# Patient Record
Sex: Male | Born: 1935
Health system: Southern US, Community
[De-identification: ages and names within clinical notes are randomized; demographics above are authoritative.]

## PROBLEM LIST (undated history)

## (undated) DIAGNOSIS — F419 Anxiety disorder, unspecified: Secondary | ICD-10-CM

## (undated) DIAGNOSIS — E079 Disorder of thyroid, unspecified: Secondary | ICD-10-CM

## (undated) DIAGNOSIS — K219 Gastro-esophageal reflux disease without esophagitis: Secondary | ICD-10-CM

## (undated) DIAGNOSIS — I6522 Occlusion and stenosis of left carotid artery: Secondary | ICD-10-CM

## (undated) HISTORY — DX: Gastro-esophageal reflux disease without esophagitis: K21.9

## (undated) HISTORY — DX: Anxiety disorder, unspecified: F41.9

## (undated) HISTORY — DX: Occlusion and stenosis of left carotid artery: I65.22

## (undated) HISTORY — DX: Disorder of thyroid, unspecified: E07.9

---

## 2005-03-10 ENCOUNTER — Emergency Department (HOSPITAL_COMMUNITY): Admission: EM | Admit: 2005-03-10 | Discharge: 2005-03-10 | Payer: Self-pay | Admitting: *Deleted

## 2007-02-01 ENCOUNTER — Encounter: Admission: RE | Admit: 2007-02-01 | Discharge: 2007-02-01 | Payer: Self-pay | Admitting: Internal Medicine

## 2007-02-15 ENCOUNTER — Encounter: Admission: RE | Admit: 2007-02-15 | Discharge: 2007-02-15 | Payer: Self-pay | Admitting: Internal Medicine

## 2008-02-24 ENCOUNTER — Encounter: Admission: RE | Admit: 2008-02-24 | Discharge: 2008-02-24 | Payer: Self-pay | Admitting: Internal Medicine

## 2009-10-30 ENCOUNTER — Encounter: Admission: RE | Admit: 2009-10-30 | Discharge: 2009-10-30 | Payer: Self-pay | Admitting: Internal Medicine

## 2010-02-05 ENCOUNTER — Encounter: Admission: RE | Admit: 2010-02-05 | Discharge: 2010-02-05 | Payer: Self-pay | Admitting: General Surgery

## 2010-10-04 ENCOUNTER — Emergency Department (HOSPITAL_COMMUNITY)
Admission: EM | Admit: 2010-10-04 | Discharge: 2010-10-04 | Disposition: A | Discharge status: Home / Self Care | Payer: Medicare Other | Attending: Emergency Medicine | Admitting: Emergency Medicine

## 2010-10-04 DIAGNOSIS — R0609 Other forms of dyspnea: Secondary | ICD-10-CM | POA: Insufficient documentation

## 2010-10-04 DIAGNOSIS — F410 Panic disorder [episodic paroxysmal anxiety] without agoraphobia: Secondary | ICD-10-CM | POA: Insufficient documentation

## 2010-10-04 DIAGNOSIS — I10 Essential (primary) hypertension: Secondary | ICD-10-CM | POA: Insufficient documentation

## 2010-10-04 DIAGNOSIS — R110 Nausea: Secondary | ICD-10-CM | POA: Insufficient documentation

## 2010-10-04 DIAGNOSIS — Z79899 Other long term (current) drug therapy: Secondary | ICD-10-CM | POA: Insufficient documentation

## 2010-10-04 DIAGNOSIS — R454 Irritability and anger: Secondary | ICD-10-CM | POA: Insufficient documentation

## 2010-10-04 DIAGNOSIS — R0989 Other specified symptoms and signs involving the circulatory and respiratory systems: Secondary | ICD-10-CM | POA: Insufficient documentation

## 2010-10-04 LAB — CBC
MCHC: 35.1 g/dL (ref 30.0–36.0)
MCV: 92.7 fL (ref 78.0–100.0)
RDW: 11.9 % (ref 11.5–15.5)
WBC: 8.4 10*3/uL (ref 4.0–10.5)

## 2010-10-04 LAB — DIFFERENTIAL
Lymphocytes Relative: 56 % — ABNORMAL HIGH (ref 12–46)
Lymphs Abs: 4.7 10*3/uL — ABNORMAL HIGH (ref 0.7–4.0)
Monocytes Absolute: 0.6 10*3/uL (ref 0.1–1.0)
Neutro Abs: 2.8 10*3/uL (ref 1.7–7.7)

## 2010-10-04 LAB — BASIC METABOLIC PANEL
BUN: 14 mg/dL (ref 6–23)
CO2: 29 mEq/L (ref 19–32)
GFR calc non Af Amer: 55 mL/min — ABNORMAL LOW (ref >60)
Glucose, Bld: 107 mg/dL — ABNORMAL HIGH (ref 70–99)

## 2012-04-12 ENCOUNTER — Other Ambulatory Visit: Payer: Self-pay | Admitting: Internal Medicine

## 2012-04-12 ENCOUNTER — Ambulatory Visit
Admission: RE | Admit: 2012-04-12 | Discharge: 2012-04-12 | Disposition: A | Discharge status: Home / Self Care | Payer: Medicare Other | Source: Ambulatory Visit | Attending: Internal Medicine | Admitting: Internal Medicine

## 2012-04-12 DIAGNOSIS — R52 Pain, unspecified: Secondary | ICD-10-CM

## 2012-04-13 ENCOUNTER — Other Ambulatory Visit: Payer: Self-pay | Admitting: Internal Medicine

## 2012-04-13 DIAGNOSIS — R52 Pain, unspecified: Secondary | ICD-10-CM

## 2013-04-11 ENCOUNTER — Other Ambulatory Visit: Payer: Self-pay | Admitting: Internal Medicine

## 2013-04-11 DIAGNOSIS — R0989 Other specified symptoms and signs involving the circulatory and respiratory systems: Secondary | ICD-10-CM

## 2013-04-13 ENCOUNTER — Ambulatory Visit
Admission: RE | Admit: 2013-04-13 | Discharge: 2013-04-13 | Disposition: A | Discharge status: Home / Self Care | Payer: Medicare Other | Source: Ambulatory Visit | Attending: Internal Medicine | Admitting: Internal Medicine

## 2013-04-13 DIAGNOSIS — R0989 Other specified symptoms and signs involving the circulatory and respiratory systems: Secondary | ICD-10-CM

## 2015-11-28 DIAGNOSIS — R972 Elevated prostate specific antigen [PSA]: Secondary | ICD-10-CM | POA: Diagnosis not present

## 2015-11-28 DIAGNOSIS — B359 Dermatophytosis, unspecified: Secondary | ICD-10-CM | POA: Diagnosis not present

## 2016-01-30 DIAGNOSIS — F419 Anxiety disorder, unspecified: Secondary | ICD-10-CM | POA: Diagnosis not present

## 2016-04-17 ENCOUNTER — Other Ambulatory Visit: Payer: Self-pay | Admitting: Internal Medicine

## 2016-04-17 ENCOUNTER — Ambulatory Visit
Admission: RE | Admit: 2016-04-17 | Discharge: 2016-04-17 | Disposition: A | Discharge status: Home / Self Care | Payer: Medicare Other | Source: Ambulatory Visit | Attending: Internal Medicine | Admitting: Internal Medicine

## 2016-04-17 DIAGNOSIS — W19XXXD Unspecified fall, subsequent encounter: Secondary | ICD-10-CM

## 2016-04-17 DIAGNOSIS — R079 Chest pain, unspecified: Secondary | ICD-10-CM | POA: Diagnosis not present

## 2016-04-17 DIAGNOSIS — S2231XA Fracture of one rib, right side, initial encounter for closed fracture: Secondary | ICD-10-CM | POA: Diagnosis not present

## 2016-04-30 DIAGNOSIS — S2239XA Fracture of one rib, unspecified side, initial encounter for closed fracture: Secondary | ICD-10-CM | POA: Diagnosis not present

## 2016-04-30 DIAGNOSIS — Z0000 Encounter for general adult medical examination without abnormal findings: Secondary | ICD-10-CM | POA: Diagnosis not present

## 2016-04-30 DIAGNOSIS — Z23 Encounter for immunization: Secondary | ICD-10-CM | POA: Diagnosis not present

## 2016-04-30 DIAGNOSIS — E039 Hypothyroidism, unspecified: Secondary | ICD-10-CM | POA: Diagnosis not present

## 2016-05-06 DIAGNOSIS — R972 Elevated prostate specific antigen [PSA]: Secondary | ICD-10-CM | POA: Diagnosis not present

## 2016-05-06 DIAGNOSIS — R351 Nocturia: Secondary | ICD-10-CM | POA: Diagnosis not present

## 2016-05-06 DIAGNOSIS — N401 Enlarged prostate with lower urinary tract symptoms: Secondary | ICD-10-CM | POA: Diagnosis not present

## 2016-07-07 DIAGNOSIS — L506 Contact urticaria: Secondary | ICD-10-CM | POA: Diagnosis not present

## 2016-07-28 DIAGNOSIS — L500 Allergic urticaria: Secondary | ICD-10-CM | POA: Diagnosis not present

## 2016-09-22 ENCOUNTER — Other Ambulatory Visit: Payer: Self-pay | Admitting: Internal Medicine

## 2016-09-22 ENCOUNTER — Ambulatory Visit
Admission: RE | Admit: 2016-09-22 | Discharge: 2016-09-22 | Disposition: A | Discharge status: Home / Self Care | Payer: Medicare Other | Source: Ambulatory Visit | Attending: Internal Medicine | Admitting: Internal Medicine

## 2016-09-22 DIAGNOSIS — I4510 Unspecified right bundle-branch block: Secondary | ICD-10-CM | POA: Diagnosis not present

## 2016-09-22 DIAGNOSIS — R0602 Shortness of breath: Secondary | ICD-10-CM

## 2016-09-22 DIAGNOSIS — R079 Chest pain, unspecified: Secondary | ICD-10-CM | POA: Diagnosis not present

## 2016-09-22 DIAGNOSIS — R0902 Hypoxemia: Secondary | ICD-10-CM | POA: Diagnosis not present

## 2016-09-24 ENCOUNTER — Other Ambulatory Visit: Payer: Self-pay | Admitting: Internal Medicine

## 2016-09-24 DIAGNOSIS — R0600 Dyspnea, unspecified: Secondary | ICD-10-CM

## 2016-09-25 ENCOUNTER — Ambulatory Visit (INDEPENDENT_AMBULATORY_CARE_PROVIDER_SITE_OTHER): Payer: Medicare Other | Admitting: Internal Medicine

## 2016-09-25 DIAGNOSIS — R0600 Dyspnea, unspecified: Secondary | ICD-10-CM

## 2016-09-25 LAB — PULMONARY FUNCTION TEST
DL/VA % PRED: 56 %
DL/VA: 2.74 ml/min/mmHg/L
DLCO COR % PRED: 41 %
DLCO UNC: 16.52 ml/min/mmHg
DLCO cor: 16.08 ml/min/mmHg
DLCO unc % pred: 43 %
FEF 25-75 POST: 1.88 L/s
FEF 25-75 Pre: 1.8 L/sec
FEF2575-%CHANGE-POST: 4 %
FEF2575-%PRED-POST: 81 %
FEF2575-%Pred-Pre: 77 %
FEV1-%CHANGE-POST: 1 %
FEV1-%PRED-POST: 82 %
FEV1-%Pred-Pre: 81 %
FEV1-Post: 2.79 L
FEV1-Pre: 2.75 L
FEV1FVC-%CHANGE-POST: 9 %
FEV1FVC-%PRED-PRE: 100 %
FEV6-%Change-Post: -8 %
FEV6-%Pred-Post: 79 %
FEV6-%Pred-Pre: 87 %
FEV6-Post: 3.54 L
FEV6-Pre: 3.86 L
FEV6FVC-%Pred-Post: 106 %
FEV6FVC-%Pred-Pre: 106 %
FVC-%CHANGE-POST: -7 %
FVC-%PRED-POST: 75 %
FVC-%PRED-PRE: 81 %
FVC-POST: 3.58 L
FVC-PRE: 3.86 L
PRE FEV6/FVC RATIO: 100 %
Post FEV1/FVC ratio: 78 %
Post FEV6/FVC ratio: 100 %
Pre FEV1/FVC ratio: 71 %
RV % PRED: 88 %
RV: 2.6 L
TLC % pred: 79 %
TLC: 6.32 L

## 2016-09-25 NOTE — Progress Notes (Signed)
PFT done today. 

## 2016-09-28 ENCOUNTER — Telehealth: Payer: Self-pay | Admitting: Internal Medicine

## 2016-09-28 NOTE — Telephone Encounter (Signed)
Called Dr. Thomasene Lot office and spoke with Toniann Fail, states she would need me to speak to the nurse who is in with a patient currently.  Will await call back.

## 2016-09-29 NOTE — Telephone Encounter (Signed)
Spoke with Kevin Mata, states that they did not get all the pages of the PFT results we faxed. This has been re-faxed to Dr Thomasene Lot office. Aware to contact our office if full fax is not received. Nothing further needed.

## 2016-09-29 NOTE — Telephone Encounter (Signed)
Kevin Mata with Dr. Thomasene Lot office returned call.  States she received the PFT report with the numbers but still needs interpretation of the report.  CB is [phone], fax [phone].

## 2016-09-30 DIAGNOSIS — R0902 Hypoxemia: Secondary | ICD-10-CM | POA: Diagnosis not present

## 2016-09-30 DIAGNOSIS — J449 Chronic obstructive pulmonary disease, unspecified: Secondary | ICD-10-CM | POA: Diagnosis not present

## 2016-12-15 DIAGNOSIS — I491 Atrial premature depolarization: Secondary | ICD-10-CM | POA: Diagnosis not present

## 2016-12-15 DIAGNOSIS — J449 Chronic obstructive pulmonary disease, unspecified: Secondary | ICD-10-CM | POA: Diagnosis not present

## 2016-12-16 ENCOUNTER — Other Ambulatory Visit (HOSPITAL_COMMUNITY): Payer: Self-pay | Admitting: Internal Medicine

## 2016-12-16 DIAGNOSIS — R0602 Shortness of breath: Secondary | ICD-10-CM

## 2016-12-18 ENCOUNTER — Ambulatory Visit (HOSPITAL_COMMUNITY)
Admission: RE | Admit: 2016-12-18 | Discharge: 2016-12-18 | Disposition: A | Discharge status: Home / Self Care | Payer: Medicare Other | Source: Ambulatory Visit | Attending: Internal Medicine | Admitting: Internal Medicine

## 2016-12-18 DIAGNOSIS — I5030 Unspecified diastolic (congestive) heart failure: Secondary | ICD-10-CM | POA: Diagnosis not present

## 2016-12-18 DIAGNOSIS — I071 Rheumatic tricuspid insufficiency: Secondary | ICD-10-CM | POA: Insufficient documentation

## 2016-12-18 DIAGNOSIS — R0602 Shortness of breath: Secondary | ICD-10-CM | POA: Diagnosis not present

## 2017-03-04 DIAGNOSIS — S0500XA Injury of conjunctiva and corneal abrasion without foreign body, unspecified eye, initial encounter: Secondary | ICD-10-CM | POA: Diagnosis not present

## 2017-03-08 DIAGNOSIS — Z23 Encounter for immunization: Secondary | ICD-10-CM | POA: Diagnosis not present

## 2017-03-16 DIAGNOSIS — H524 Presbyopia: Secondary | ICD-10-CM | POA: Diagnosis not present

## 2017-04-20 DIAGNOSIS — K639 Disease of intestine, unspecified: Secondary | ICD-10-CM | POA: Diagnosis not present

## 2017-04-20 DIAGNOSIS — K5120 Ulcerative (chronic) proctitis without complications: Secondary | ICD-10-CM | POA: Diagnosis not present

## 2017-05-04 DIAGNOSIS — E039 Hypothyroidism, unspecified: Secondary | ICD-10-CM | POA: Diagnosis not present

## 2017-05-04 DIAGNOSIS — I739 Peripheral vascular disease, unspecified: Secondary | ICD-10-CM | POA: Diagnosis not present

## 2017-05-04 DIAGNOSIS — N200 Calculus of kidney: Secondary | ICD-10-CM | POA: Diagnosis not present

## 2017-05-04 DIAGNOSIS — Z6841 Body Mass Index (BMI) 40.0 and over, adult: Secondary | ICD-10-CM | POA: Diagnosis not present

## 2017-07-05 DIAGNOSIS — K1370 Unspecified lesions of oral mucosa: Secondary | ICD-10-CM | POA: Diagnosis not present

## 2017-07-05 DIAGNOSIS — E7800 Pure hypercholesterolemia, unspecified: Secondary | ICD-10-CM | POA: Diagnosis not present

## 2017-07-05 DIAGNOSIS — E871 Hypo-osmolality and hyponatremia: Secondary | ICD-10-CM | POA: Diagnosis not present

## 2017-07-05 DIAGNOSIS — I10 Essential (primary) hypertension: Secondary | ICD-10-CM | POA: Diagnosis not present

## 2017-09-02 DIAGNOSIS — J392 Other diseases of pharynx: Secondary | ICD-10-CM | POA: Diagnosis not present

## 2017-09-02 DIAGNOSIS — E871 Hypo-osmolality and hyponatremia: Secondary | ICD-10-CM | POA: Diagnosis not present

## 2017-12-29 DIAGNOSIS — I491 Atrial premature depolarization: Secondary | ICD-10-CM | POA: Diagnosis not present

## 2017-12-29 DIAGNOSIS — R42 Dizziness and giddiness: Secondary | ICD-10-CM | POA: Diagnosis not present

## 2018-03-16 DIAGNOSIS — Z23 Encounter for immunization: Secondary | ICD-10-CM | POA: Diagnosis not present

## 2018-05-05 DIAGNOSIS — M11261 Other chondrocalcinosis, right knee: Secondary | ICD-10-CM | POA: Diagnosis not present

## 2018-05-05 DIAGNOSIS — E039 Hypothyroidism, unspecified: Secondary | ICD-10-CM | POA: Diagnosis not present

## 2018-05-05 DIAGNOSIS — N200 Calculus of kidney: Secondary | ICD-10-CM | POA: Diagnosis not present

## 2018-05-05 DIAGNOSIS — E7800 Pure hypercholesterolemia, unspecified: Secondary | ICD-10-CM | POA: Diagnosis not present

## 2018-05-05 DIAGNOSIS — I10 Essential (primary) hypertension: Secondary | ICD-10-CM | POA: Diagnosis not present

## 2018-05-05 DIAGNOSIS — J441 Chronic obstructive pulmonary disease with (acute) exacerbation: Secondary | ICD-10-CM | POA: Diagnosis not present

## 2018-05-09 DIAGNOSIS — H6123 Impacted cerumen, bilateral: Secondary | ICD-10-CM | POA: Diagnosis not present

## 2018-10-26 DIAGNOSIS — D559 Anemia due to enzyme disorder, unspecified: Secondary | ICD-10-CM | POA: Diagnosis not present

## 2018-10-26 DIAGNOSIS — R55 Syncope and collapse: Secondary | ICD-10-CM | POA: Diagnosis not present

## 2018-10-26 DIAGNOSIS — R799 Abnormal finding of blood chemistry, unspecified: Secondary | ICD-10-CM | POA: Diagnosis not present

## 2018-10-26 DIAGNOSIS — E039 Hypothyroidism, unspecified: Secondary | ICD-10-CM | POA: Diagnosis not present

## 2018-10-26 DIAGNOSIS — J449 Chronic obstructive pulmonary disease, unspecified: Secondary | ICD-10-CM | POA: Diagnosis not present

## 2018-10-26 DIAGNOSIS — E038 Other specified hypothyroidism: Secondary | ICD-10-CM | POA: Diagnosis not present

## 2018-11-09 DIAGNOSIS — I471 Supraventricular tachycardia: Secondary | ICD-10-CM | POA: Diagnosis not present

## 2018-12-30 DIAGNOSIS — R419 Unspecified symptoms and signs involving cognitive functions and awareness: Secondary | ICD-10-CM | POA: Diagnosis not present

## 2019-02-03 IMAGING — CR DG CHEST 2V
2 series · 2 of 2 positions shown · non-contrast
Comparison: 04/17/2016

CLINICAL DATA: Shortness of breath with exertion, dizziness, former
smoker

EXAM:
CHEST  2 VIEW

[w chest pa]
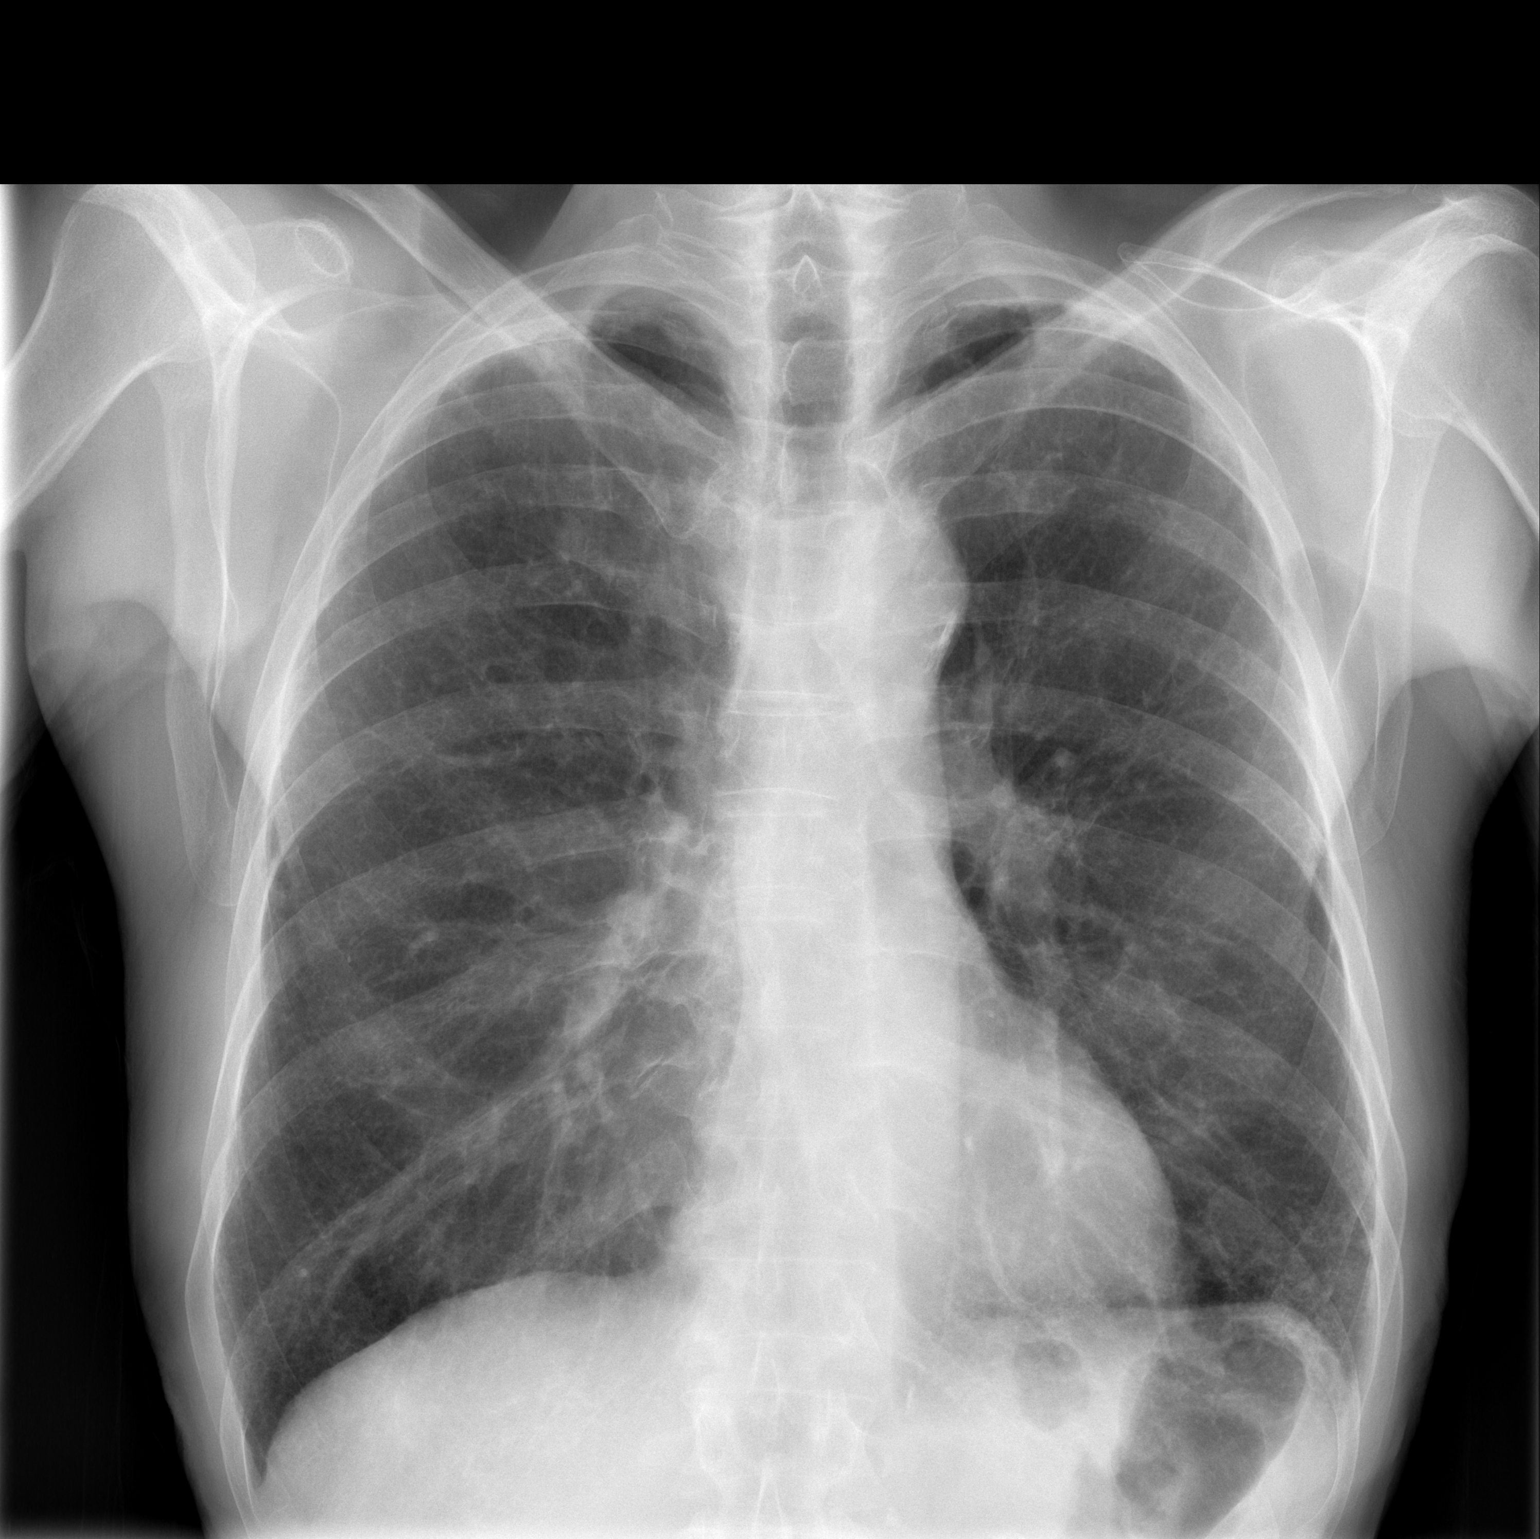

[w chest lat]
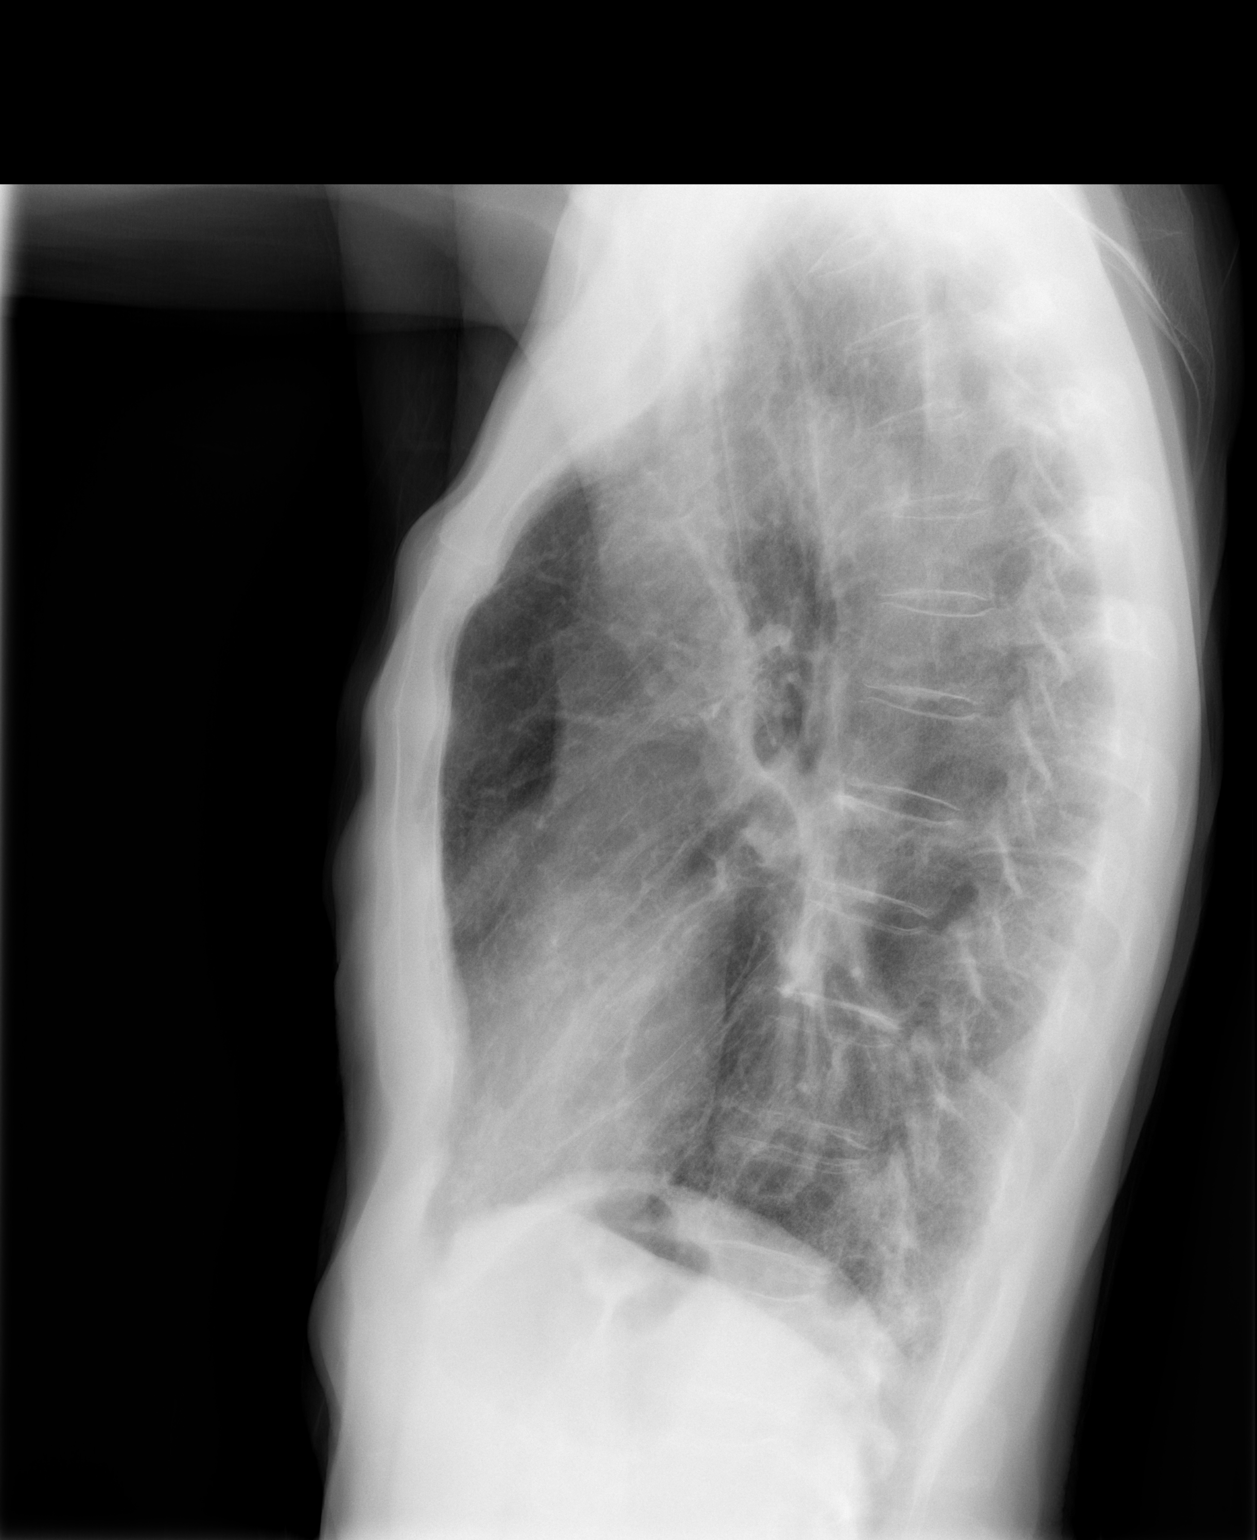

[2 of 2 positions shown; findings below may reference images not displayed]

FINDINGS: Normal heart size, mediastinal contours, and pulmonary vascularity.

Atherosclerotic calcification aorta.

Lungs appear emphysematous with mild biapical scarring.

Mild chronic interstitial changes/fibrosis at the LEFT lung base.

Remaining lungs clear.

No pleural effusion or pneumothorax.

Bones demineralized.
IMPRESSION: Emphysematous changes with biapical scarring and LEFT basilar
fibrosis.

No acute abnormalities.

## 2019-02-14 DIAGNOSIS — Z23 Encounter for immunization: Secondary | ICD-10-CM | POA: Diagnosis not present

## 2019-05-09 DIAGNOSIS — E039 Hypothyroidism, unspecified: Secondary | ICD-10-CM | POA: Diagnosis not present

## 2019-05-09 DIAGNOSIS — K5700 Diverticulitis of small intestine with perforation and abscess without bleeding: Secondary | ICD-10-CM | POA: Diagnosis not present

## 2019-05-09 DIAGNOSIS — I10 Essential (primary) hypertension: Secondary | ICD-10-CM | POA: Diagnosis not present

## 2019-05-09 DIAGNOSIS — I491 Atrial premature depolarization: Secondary | ICD-10-CM | POA: Diagnosis not present

## 2019-05-09 DIAGNOSIS — K279 Peptic ulcer, site unspecified, unspecified as acute or chronic, without hemorrhage or perforation: Secondary | ICD-10-CM | POA: Diagnosis not present

## 2019-05-09 DIAGNOSIS — J449 Chronic obstructive pulmonary disease, unspecified: Secondary | ICD-10-CM | POA: Diagnosis not present

## 2019-07-27 DIAGNOSIS — R0600 Dyspnea, unspecified: Secondary | ICD-10-CM | POA: Diagnosis not present

## 2019-07-27 DIAGNOSIS — F419 Anxiety disorder, unspecified: Secondary | ICD-10-CM | POA: Diagnosis not present

## 2019-07-27 DIAGNOSIS — J449 Chronic obstructive pulmonary disease, unspecified: Secondary | ICD-10-CM | POA: Diagnosis not present

## 2019-07-27 DIAGNOSIS — E7849 Other hyperlipidemia: Secondary | ICD-10-CM | POA: Diagnosis not present

## 2019-07-27 DIAGNOSIS — E46 Unspecified protein-calorie malnutrition: Secondary | ICD-10-CM | POA: Diagnosis not present

## 2019-08-04 ENCOUNTER — Ambulatory Visit: Payer: Medicare Other

## 2019-08-04 ENCOUNTER — Encounter: Payer: Self-pay | Admitting: Cardiology

## 2019-08-04 ENCOUNTER — Other Ambulatory Visit: Payer: Self-pay

## 2019-08-04 ENCOUNTER — Ambulatory Visit: Payer: Medicare Other | Admitting: Cardiology

## 2019-08-04 VITALS — BP 146/70 | HR 82 | Temp 98.6°F | Resp 11 | Ht 75.0 in | Wt 145.0 lb

## 2019-08-04 DIAGNOSIS — I951 Orthostatic hypotension: Secondary | ICD-10-CM | POA: Diagnosis not present

## 2019-08-04 DIAGNOSIS — R42 Dizziness and giddiness: Secondary | ICD-10-CM | POA: Diagnosis not present

## 2019-08-04 DIAGNOSIS — I4510 Unspecified right bundle-branch block: Secondary | ICD-10-CM

## 2019-08-04 DIAGNOSIS — Z87891 Personal history of nicotine dependence: Secondary | ICD-10-CM | POA: Diagnosis not present

## 2019-08-04 NOTE — Patient Instructions (Signed)
Please remember to bring in your medication bottles in at the next visit.   New Medications that were added at today's visit:  None  Medications that were discontinued at today's visit: None  Office will call you to have the following tests scheduled:  Echo  Monitor Carotid ultrasound  Recommend follow up with your PCP as scheduled.

## 2019-08-04 NOTE — Progress Notes (Signed)
REASON FOR CONSULT: Orthostatic hypotension  Chief Complaint  Patient presents with  . Orthostatic hypotension    New Patient    REQUESTING PHYSICIAN:  Sueanne Margarita, DO [address] [place],  [place] [address]  HPI  Kevin Mata is a 86 y.o. male who presents to the office with a chief complaint of "lightheaded and dizziness." Patient's past medical history and cardiac risk factors include: Hypothyroidism, GERD, advanced age, former smoker  Patient is referred to the office at the request of his primary care physician regarding evaluation of orthostatic hypotension.  I am seeing the patient for the first time.  He is accompanied by his wife at today's office visit.  Lightheaded and dizziness: Patient's wife states that the patient had felt twice since November 2020.  And progressively has been noticing more lightheaded and dizziness.  Both of the falls were attributed to be mechanical in nature.  He has never had near syncope or syncopal events.  Patient states that the symptoms are progressive over the last 2 to 3 weeks.  He notices them more often in the morning as opposed to the evening hours.  He also notices symptoms worsen with quick changes in position and turning his head side to side.  Patient's wife states that he is been eating and drinking well.  He has gained 1 pound since he last saw his primary care physician but overall has lost 5 pounds since November 2020.  Patient's wife thinks that it could be secondary to his underlying thyroid medications.  This is because on Sunday when he does not take his thyroid medications as prescribed he does not have any symptoms of lightheaded and dizziness.  Review of systems positive for:  lightheadedness, dizziness, shortness of breath with effort related symptoms (at times chronic and stable due to emphysema per patient). Currently patient denies chest pain, palpitations, orthopnea, paroxysmal nocturnal dyspnea, lower extremity  swelling, near syncope, syncopal events, hematochezia, hemoptysis, hematemesis, melanotic stools, no symptoms of amaurosis fugax, motor or sensory symptoms or dysphasia in the last 6 months.   Denies prior history of coronary artery disease, myocardial infarction, congestive heart failure, deep venous thrombosis, pulmonary embolism, stroke, transient ischemic attack.  No history of premature coronary disease in the family or sudden cardiac death.  ALLERGIES: Allergies  Allergen Reactions  . Penicillins     Hives and swelling  . Sulfur    MEDICATION LIST PRIOR TO VISIT: Current Outpatient Medications on File Prior to Visit  Medication Sig Dispense Refill  . clonazePAM (KLONOPIN) 0.5 MG tablet Take 0.5 mg by mouth 2 (two) times daily as needed for anxiety.    Marland Kitchen levothyroxine (SYNTHROID) 100 MCG tablet Take 100 mcg by mouth daily before breakfast.    . omeprazole (PRILOSEC) 20 MG capsule Take 20 mg by mouth daily. Discuss omeprazole with patient, please.    . traMADol (ULTRAM) 50 MG tablet Take 50 mg by mouth daily.     No current facility-administered medications on file prior to visit.    PAST MEDICAL HISTORY: Past Medical History:  Diagnosis Date  . Anxiety   . GERD (gastroesophageal reflux disease)   . Thyroid disease     PAST SURGICAL HISTORY: History reviewed. No pertinent surgical history.  FAMILY HISTORY: The patient family history includes Dementia in his mother.   SOCIAL HISTORY:  The patient  reports that he quit smoking about 4 years ago. His smoking use included cigarettes. His smokeless tobacco use includes chew. He  reports that he does not drink alcohol or use drugs.  14 ORGAN REVIEW OF SYSTEMS: CONSTITUTIONAL: No fever or significant weight loss EYES: No recent significant visual change EARS, NOSE, MOUTH, THROAT: No recent significant change in hearing CARDIOVASCULAR: See discussion in subjective/HPI RESPIRATORY: See discussion in  subjective/HPI GASTROINTESTINAL: No recent complaints of abdominal pain GENITOURINARY: No recent significant change in genitourinary status MUSCULOSKELETAL: No recent significant change in musculoskeletal status INTEGUMENTARY: No recent rash NEUROLOGIC: No recent significant change in motor function PSYCHIATRIC: No recent significant change in mood ENDOCRINOLOGIC: No recent significant change in endocrine status HEMATOLOGIC/LYMPHATIC: No recent significant unexpected bruising ALLERGIC/IMMUNOLOGIC: No recent unexplained allergic reaction  PHYSICAL EXAM: Blood pressure 111/77, pulse 87, temperature 98.6 F (37 C), temperature source Skin, resp. rate 11, height 6' 3"  (1.905 m), weight 145 lb (65.8 kg), SpO2 98 %. Orthostatic VS for the past 72 hrs (Last 3 readings):  Orthostatic BP Patient Position BP Location Cuff Size Orthostatic Pulse  08/04/19 1013 111/77 Standing Left Arm Normal 87  08/04/19 1012 146/78 Sitting Left Arm Normal 78  08/04/19 1010 148/78 Supine Left Arm Normal 85   CONSTITUTIONAL: Well-developed and well-nourished. No acute distress.  SKIN: Skin is warm and dry. No rash noted. No cyanosis. No pallor. No jaundice HEAD: Normocephalic and atraumatic.  EYES: No scleral icterus MOUTH/THROAT: Moist oral membranes.  NECK: No JVD present. No thyromegaly noted.  Soft carotid bruit on the right side. LYMPHATIC: No visible cervical adenopathy.  CHEST Normal respiratory effort. No intercostal retractions  LUNGS: Clear to auscultation bilaterally.  No stridor. No wheezes. No rales.  CARDIOVASCULAR: Regular rate and rhythm, positive E5-I7, soft holosystolic murmur heard at the left sternal border, no gallops or rubs appreciated. ABDOMINAL: Not obese, soft, nontender not distended, positive bowel sounds in all 4 quadrants, no abdominal bruits appreciated.  No apparent ascites.  EXTREMITIES: No peripheral edema.  Warm to touch. HEMATOLOGIC: No significant bruising NEUROLOGIC:  Oriented to person, place, and time. Nonfocal. Normal muscle tone.  PSYCHIATRIC: Normal mood and affect. Normal behavior. Cooperative  CARDIAC DATABASE: EKG: 08/04/2019: Normal sinus rhythm with a ventricular rate of 81 bpm, right bundle branch block, right axis deviation, no obvious evidence of underlying ischemia or injury pattern.  Echocardiogram: 12/18/2008 2018 at [facility]: LVEF 78-24%, grade 1 diastolic impairment, mild to moderate tricuspid regurgitation.  Stress Testing:  None  Heart Catheterization: None  LABORATORY DATA: External Labs: Collected: 07/27/2019 Creatinine 1.1 mg/dL. eGFR: 63.8 mL/min per 1.73 m TSH: 0.64  FINAL MEDICATION LIST END OF ENCOUNTER: No orders of the defined types were placed in this encounter.   There are no discontinued medications.   Current Outpatient Medications:  .  clonazePAM (KLONOPIN) 0.5 MG tablet, Take 0.5 mg by mouth 2 (two) times daily as needed for anxiety., Disp: , Rfl:  .  levothyroxine (SYNTHROID) 100 MCG tablet, Take 100 mcg by mouth daily before breakfast., Disp: , Rfl:  .  omeprazole (PRILOSEC) 20 MG capsule, Take 20 mg by mouth daily. Discuss omeprazole with patient, please., Disp: , Rfl:  .  traMADol (ULTRAM) 50 MG tablet, Take 50 mg by mouth daily., Disp: , Rfl:   IMPRESSION:    ICD-10-CM   1. Orthostatic hypotension  I95.1 EKG 12-Lead    PCV ECHOCARDIOGRAM COMPLETE    HOLTER MONITOR - 48 HOUR  2. Dizziness  R42 PCV CAROTID DUPLEX (BILATERAL)  3. RBBB (right bundle branch block)  I45.10   4. Former smoker  Z87.891      RECOMMENDATIONS: Kevin Saxon  JADEN Mata is a 86 y.o. male whose past medical history and cardiac risk factors include: Hypothyroidism, GERD, advanced age, former smoker.  Orthostatic hypotension:  Patient has symptomatic orthostatic hypotension without episodes of near syncope or syncopal events.  No reversible causes identified.  Compression hose stockings.  We discussed standard patient education  with regard to vasovagal syncope;  Be aware of your susceptibility in this regard.  ie patient education.  Pay attention to premonitory symptoms.  Take evasive action when premonitory symptoms occur; preferably lay down if possible.  If unable to lay down consider using the ankle cross leg squeeze maneuver.  We spent some time teaching and discussing the ankle cross leg squeeze maneuver today.  Keep well-hydrated.  Allow salt in the diet.  Encourage stand slowly.  Plan echocardiogram to evaluate for structural heart disease and LV function.  48-hour Holter monitor to rule out underlying arrhythmic burden.  Carotid duplex to rule out carotid artery atherosclerosis given his symptoms and findings suggestive of carotid bruit on examination.  Patient and his wife had concerns in regards to his Synthroid dosing.  I have educated them to have this addressed either by his primary care physician and/or endocrinology.  In regards to his weight loss he is currently working with his primary care physician on having this addressed.  We also discussed medication options such as midodrine; however, patient and his wife would like to try nonpharmacological therapy first for which I was in agreement for as well.  Dizziness: See above  Right bundle branch block: Suggested to be chronic and stable present on prior EKGs.  Continue to monitor.  Former smoker: Educated on the importance of continued smoking cessation.  Orders Placed This Encounter  Procedures  . HOLTER MONITOR - 48 HOUR  . EKG 12-Lead  . PCV ECHOCARDIOGRAM COMPLETE  . PCV CAROTID DUPLEX (BILATERAL)   --Continue cardiac medications as reconciled in final medication list. --Return in about 6 weeks (around 09/15/2019) for Discussion of test results.. Or sooner if needed. --Continue follow-up with your primary care physician regarding the management of your other chronic comorbid conditions.  Patient's questions and concerns were  addressed to his satisfaction. He voices understanding of the instructions provided during this encounter.   This note was created using a voice recognition software as a result there may be grammatical errors inadvertently enclosed that do not reflect the nature of this encounter. Every attempt is made to correct such errors.  Rex Kras, DO, [facility]. [place] Office: [phone]

## 2019-08-16 ENCOUNTER — Other Ambulatory Visit: Payer: Self-pay

## 2019-08-16 ENCOUNTER — Ambulatory Visit: Payer: Medicare Other

## 2019-08-16 DIAGNOSIS — I951 Orthostatic hypotension: Secondary | ICD-10-CM

## 2019-08-16 DIAGNOSIS — R42 Dizziness and giddiness: Secondary | ICD-10-CM

## 2019-08-16 DIAGNOSIS — I6523 Occlusion and stenosis of bilateral carotid arteries: Secondary | ICD-10-CM

## 2019-08-20 ENCOUNTER — Other Ambulatory Visit: Payer: Self-pay | Admitting: Cardiology

## 2019-08-20 DIAGNOSIS — R918 Other nonspecific abnormal finding of lung field: Secondary | ICD-10-CM

## 2019-08-20 DIAGNOSIS — I6523 Occlusion and stenosis of bilateral carotid arteries: Secondary | ICD-10-CM

## 2019-09-05 ENCOUNTER — Telehealth: Payer: Self-pay

## 2019-09-05 NOTE — Telephone Encounter (Signed)
Left voicemail for patient to cb.

## 2019-09-05 NOTE — Telephone Encounter (Signed)
-----   Message from [place], [place] sent at 09/02/2019  3:00 PM EDT ----- The left ventricular ejection fraction or pumping activity of the heart is within the normal limit. Details can be reviewed at the next office visit.

## 2019-09-05 NOTE — Progress Notes (Signed)
Relayed information to patient. Patient voiced understanding.  

## 2019-09-05 NOTE — Telephone Encounter (Signed)
Spoke with patient about results. Patient voiced understanding.

## 2019-09-08 DIAGNOSIS — R0600 Dyspnea, unspecified: Secondary | ICD-10-CM | POA: Diagnosis not present

## 2019-09-08 DIAGNOSIS — R918 Other nonspecific abnormal finding of lung field: Secondary | ICD-10-CM | POA: Diagnosis not present

## 2019-09-08 DIAGNOSIS — J449 Chronic obstructive pulmonary disease, unspecified: Secondary | ICD-10-CM | POA: Diagnosis not present

## 2019-09-11 ENCOUNTER — Telehealth: Payer: Self-pay

## 2019-09-11 DIAGNOSIS — E46 Unspecified protein-calorie malnutrition: Secondary | ICD-10-CM | POA: Diagnosis not present

## 2019-09-11 DIAGNOSIS — E7849 Other hyperlipidemia: Secondary | ICD-10-CM | POA: Diagnosis not present

## 2019-09-11 DIAGNOSIS — E038 Other specified hypothyroidism: Secondary | ICD-10-CM | POA: Diagnosis not present

## 2019-09-11 DIAGNOSIS — E039 Hypothyroidism, unspecified: Secondary | ICD-10-CM | POA: Diagnosis not present

## 2019-09-11 NOTE — Telephone Encounter (Signed)
-----   Message from [place], [place] sent at 09/09/2019  1:10 PM EDT ----- Please inform patient that his 48-hour Holter noted the underlying rhythm to be normal sinus.  Heart rate ranging from 49bpm to 142 bpm, average heart rate 69 bpm. We will go over the patient activated events and additional details of the report at the next office visit.

## 2019-09-11 NOTE — Telephone Encounter (Signed)
Left vm to cb.

## 2019-09-12 NOTE — Telephone Encounter (Signed)
Error

## 2019-09-15 ENCOUNTER — Encounter: Payer: Self-pay | Admitting: Cardiology

## 2019-09-15 ENCOUNTER — Other Ambulatory Visit: Payer: Self-pay

## 2019-09-15 ENCOUNTER — Ambulatory Visit: Payer: Medicare Other | Admitting: Cardiology

## 2019-09-15 VITALS — BP 126/72 | HR 65 | Temp 97.6°F | Resp 15 | Ht 75.0 in | Wt 142.0 lb

## 2019-09-15 DIAGNOSIS — E039 Hypothyroidism, unspecified: Secondary | ICD-10-CM

## 2019-09-15 DIAGNOSIS — I6523 Occlusion and stenosis of bilateral carotid arteries: Secondary | ICD-10-CM

## 2019-09-15 DIAGNOSIS — Z712 Person consulting for explanation of examination or test findings: Secondary | ICD-10-CM

## 2019-09-15 DIAGNOSIS — I951 Orthostatic hypotension: Secondary | ICD-10-CM

## 2019-09-15 DIAGNOSIS — I4510 Unspecified right bundle-branch block: Secondary | ICD-10-CM

## 2019-09-15 DIAGNOSIS — Z87891 Personal history of nicotine dependence: Secondary | ICD-10-CM

## 2019-09-15 NOTE — Progress Notes (Signed)
Kevin Mata Date of Birth: 1932-08-17 MRN: [id number] Primary Care Provider:Skakle, Liane Comber DO Former Cardiology Providers: None Primary Cardiologist: Rex Kras, DO (March 2021-present) Date: 09/15/19 Last Office Visit: August 04, 2019  Chief Complaint  Patient presents with  . Follow-up  . Results   HPI  Kevin Mata is a 86 y.o. male who presents to the office with a chief complaint of "follow-up on test results and orthostatic hypotension." Patient's past medical history and cardiac risk factors include: Hypothyroidism, GERD, advanced age, former smoker  Patient was referred to the office at the request of his primary care physician in March 2021 regarding evaluation of orthostatic hypotension.  He is accompanied by his wife at today's office visit.  Since last office visit patient states that his lightheadedness and dizziness have improved significantly.  He has implemented lifestyle changes as discussed at the last office visit.  He states that the lightheadedness can surface if he changes positions too quickly.   He underwent an echocardiogram which illustrates preserved left ventricular systolic function.  Additional details noted below for further reference.  Bilateral carotid arteries demonstrate less than 50% stenosis.  Holter monitor results reviewed with the patient and his wife at today's office visit and noted below.  Patient's Synthroid dose has also been reduced is currently following up with PCP.  Denies prior history of coronary artery disease, myocardial infarction, congestive heart failure, deep venous thrombosis, pulmonary embolism, stroke, transient ischemic attack.  No history of premature coronary disease in the family or sudden cardiac death.  ALLERGIES: Allergies  Allergen Reactions  . Penicillins     Hives and swelling  . Sulfur    MEDICATION LIST PRIOR TO VISIT: Current Outpatient Medications on File Prior to Visit  Medication Sig Dispense  Refill  . clonazePAM (KLONOPIN) 0.5 MG tablet Take 0.5 mg by mouth 2 (two) times daily as needed for anxiety.    Marland Kitchen levothyroxine (SYNTHROID) 100 MCG tablet Take 75 mcg by mouth daily before breakfast.     . traMADol (ULTRAM) 50 MG tablet Take 50 mg by mouth as needed.     Marland Kitchen omeprazole (PRILOSEC) 20 MG capsule Take 20 mg by mouth daily.     No current facility-administered medications on file prior to visit.    PAST MEDICAL HISTORY: Past Medical History:  Diagnosis Date  . Anxiety   . GERD (gastroesophageal reflux disease)   . Thyroid disease     PAST SURGICAL HISTORY: History reviewed. No pertinent surgical history.  FAMILY HISTORY: The patient family history includes Dementia in his mother.   SOCIAL HISTORY:  The patient  reports that he quit smoking about 4 years ago. His smoking use included cigarettes. His smokeless tobacco use includes chew. He reports that he does not drink alcohol or use drugs.  Review of Systems  Constitution: Negative for chills and fever.  HENT: Negative for ear discharge, ear pain and nosebleeds.   Eyes: Negative for blurred vision and discharge.  Cardiovascular: Negative for chest pain, claudication, dyspnea on exertion, leg swelling, near-syncope, orthopnea, palpitations, paroxysmal nocturnal dyspnea and syncope.  Respiratory: Negative for cough and shortness of breath.   Endocrine: Negative for polydipsia, polyphagia and polyuria.  Hematologic/Lymphatic: Negative for bleeding problem.  Skin: Negative for flushing and nail changes.  Musculoskeletal: Negative for muscle cramps, muscle weakness and myalgias.  Gastrointestinal: Negative for abdominal pain, dysphagia, hematemesis, hematochezia, melena, nausea and vomiting.  Neurological: Positive for light-headedness (when changing position quickly (chrnoic) better than last visit ).  Negative for dizziness and focal weakness.    PHYSICAL EXAM: Vitals:   09/15/19 0915 09/15/19 0944 09/15/19 0945  09/15/19 0946  BP: 126/72     Pulse: 65     Temp: 97.6 F (36.4 C)     Resp: 15     Height: 6' 3"  (1.905 m)     Weight: 142 lb (64.4 kg)     SpO2: 98% 99% 99% 100%  TempSrc: Temporal     BMI (Calculated): 17.75       Orthostatic VS for the past 72 hrs (Last 3 readings):  Orthostatic BP Patient Position BP Location Cuff Size Orthostatic Pulse  09/15/19 0946 109/60 Standing Left Arm Large 61  09/15/19 0945 106/65 Sitting Left Arm Large 53  09/15/19 0944 118/64 Supine Left Arm Large 58   CONSTITUTIONAL: Well-developed and well-nourished. No acute distress.  SKIN: Skin is warm and dry. No rash noted. No cyanosis. No pallor. No jaundice HEAD: Normocephalic and atraumatic.  EYES: No scleral icterus MOUTH/THROAT: Moist oral membranes.  NECK: No JVD present. No thyromegaly noted.  Soft carotid bruit on the right side. LYMPHATIC: No visible cervical adenopathy.  CHEST Normal respiratory effort. No intercostal retractions  LUNGS: Clear to auscultation bilaterally.  No stridor. No wheezes. No rales.  CARDIOVASCULAR: Regular rate and rhythm, positive D6-U4, soft holosystolic murmur heard at the left sternal border, no gallops or rubs appreciated. ABDOMINAL: Not obese, soft, nontender not distended, positive bowel sounds in all 4 quadrants, no abdominal bruits appreciated.  No apparent ascites.  EXTREMITIES: No peripheral edema.  Warm to touch. HEMATOLOGIC: No significant bruising NEUROLOGIC: Oriented to person, place, and time. Nonfocal. Normal muscle tone.  PSYCHIATRIC: Normal mood and affect. Normal behavior. Cooperative  CARDIAC DATABASE: EKG: 08/04/2019: Normal sinus rhythm with a ventricular rate of 81 bpm, right bundle branch block, right axis deviation, no obvious evidence of underlying ischemia or injury pattern.  Echocardiogram: 12/18/2008 2018 at [facility]: LVEF 40-34%, grade 1 diastolic impairment, mild to moderate tricuspid regurgitation.  08/16/2019: 1. Normal LV systolic  function with visual EF 60-65%. Left ventricle cavity is normal in size. Normal wall thickness. Normal global wall motion. Normal diastolic filling pattern. No obvious regional wall motion abnormalities. Calculated EF 60%. 2. Aortic sclerosis without stenosis. Trace aortic regurgitation. 3. Mild mitral valve leaflet thickening. Normal mitral valve leaflet mobility. No evidence of mitral valve stenosis. Trace mitral regurgitation. 4. Normal tricuspid valve leaflet mobility. Mild tricuspid valve leaflet thickening. No evidence of tricuspid stenosis. Moderate tricuspid regurgitation.  Stress Testing:  None  Heart Catheterization: None  48 hour Holter monitor August 04, 2019-August 06, 2019: Dominant rhythm normal sinus rhythm, followed by sinus bradycardia (25%). Heart rate 49 (1:16 AM)-142 bpm.  Average heart rate 69 bpm. No atrial fibrillation/atrial flutter/ventricular tachycardia/high grade AV block, sinus pause greater than or equal to 3 seconds in duration. Patient had six episodes of supraventricular tachycardia with the longest duration being 9 beats, at 8:54 AM, and the fastest episode was 142 bpm at 6:38 AM. Total ventricular ectopic burden <1%. Total supraventricular ectopic burden <1%. Number of patient triggered events: 3.  No symptoms documented. These did not correlate with any significant dysrhythmias  Carotid artery duplex  08/16/2019: Duplex suggests stenosis in the right common carotid artery (<50%). Duplex suggests stenosis in the left internal carotid artery (16-49%). Left CCA stenosis of <50% Antegrade right vertebral artery flow. Antegrade left vertebral artery flow. Follow up in one year is appropriate if clinically indicated.  LABORATORY DATA: External  Labs: Collected: 07/27/2019 Creatinine 1.1 mg/dL. eGFR: 63.8 mL/min per 1.73 m TSH: 0.64  FINAL MEDICATION LIST END OF ENCOUNTER: No orders of the defined types were placed in this encounter.   There are no  discontinued medications.   Current Outpatient Medications:  .  clonazePAM (KLONOPIN) 0.5 MG tablet, Take 0.5 mg by mouth 2 (two) times daily as needed for anxiety., Disp: , Rfl:  .  levothyroxine (SYNTHROID) 100 MCG tablet, Take 75 mcg by mouth daily before breakfast. , Disp: , Rfl:  .  traMADol (ULTRAM) 50 MG tablet, Take 50 mg by mouth as needed. , Disp: , Rfl:  .  omeprazole (PRILOSEC) 20 MG capsule, Take 20 mg by mouth daily., Disp: , Rfl:   IMPRESSION:    ICD-10-CM   1. Orthostatic hypotension  I95.1   2. Atherosclerosis of both carotid arteries  I65.23   3. RBBB (right bundle branch block)  I45.10   4. Former smoker  Z87.891   5. Hypothyroidism, unspecified type  E03.9   6. Encounter to discuss test results  Z71.2      RECOMMENDATIONS: Kevin Mata is a 86 y.o. male whose past medical history and cardiac risk factors include: Hypothyroidism, GERD, advanced age, former smoker.  Orthostatic hypotension: Resolved  Patient has implemented lifestyle changes as discussed at the last office visit.  We discussed standard patient education with regard to vasovagal syncope;  Be aware of your susceptibility in this regard.  ie patient education.  Pay attention to premonitory symptoms.  Take evasive action when premonitory symptoms occur; preferably lay down if possible.  If unable to lay down consider using the ankle cross leg squeeze maneuver.  We spent some time teaching and discussing the ankle cross leg squeeze maneuver today.  Keep well-hydrated.  Allow salt in the diet.  Encourage stand slowly.  Orthostatic vital signs are unremarkable.  Echocardiogram, Holter, and carotid duplex results reviewed with the patient and his wife plan.  Patient has not been wearing compression stockings for now.    In since last office visit his Synthroid dose has been reduced.   Right bundle branch block: Suggested to be chronic and stable present on prior EKGs.  Continue to  monitor.  Former smoker: Educated on the importance of continued smoking cessation.  --Continue cardiac medications as reconciled in final medication list. --Return in about 6 months (around 03/16/2020).. Or sooner if needed. --Continue follow-up with your primary care physician regarding the management of your other chronic comorbid conditions.  Patient's questions and concerns were addressed to his satisfaction. He voices understanding of the instructions provided during this encounter.   This note was created using a voice recognition software as a result there may be grammatical errors inadvertently enclosed that do not reflect the nature of this encounter. Every attempt is made to correct such errors.  Rex Kras, DO, [facility]. [place] Office: [phone]

## 2019-09-18 DIAGNOSIS — J8410 Pulmonary fibrosis, unspecified: Secondary | ICD-10-CM | POA: Diagnosis not present

## 2019-09-18 DIAGNOSIS — J479 Bronchiectasis, uncomplicated: Secondary | ICD-10-CM | POA: Diagnosis not present

## 2019-09-18 DIAGNOSIS — R918 Other nonspecific abnormal finding of lung field: Secondary | ICD-10-CM | POA: Diagnosis not present

## 2019-09-27 DIAGNOSIS — R0600 Dyspnea, unspecified: Secondary | ICD-10-CM | POA: Diagnosis not present

## 2019-09-27 DIAGNOSIS — R918 Other nonspecific abnormal finding of lung field: Secondary | ICD-10-CM | POA: Diagnosis not present

## 2019-09-27 DIAGNOSIS — E46 Unspecified protein-calorie malnutrition: Secondary | ICD-10-CM | POA: Diagnosis not present

## 2019-09-27 DIAGNOSIS — R911 Solitary pulmonary nodule: Secondary | ICD-10-CM | POA: Diagnosis not present

## 2019-10-17 DIAGNOSIS — E039 Hypothyroidism, unspecified: Secondary | ICD-10-CM | POA: Diagnosis not present

## 2019-10-17 DIAGNOSIS — E038 Other specified hypothyroidism: Secondary | ICD-10-CM | POA: Diagnosis not present

## 2019-10-19 DIAGNOSIS — F99 Mental disorder, not otherwise specified: Secondary | ICD-10-CM | POA: Diagnosis not present

## 2019-10-19 DIAGNOSIS — E785 Hyperlipidemia, unspecified: Secondary | ICD-10-CM | POA: Diagnosis not present

## 2019-10-19 DIAGNOSIS — R42 Dizziness and giddiness: Secondary | ICD-10-CM | POA: Diagnosis not present

## 2019-10-19 DIAGNOSIS — R519 Headache, unspecified: Secondary | ICD-10-CM | POA: Diagnosis not present

## 2019-10-27 DIAGNOSIS — F99 Mental disorder, not otherwise specified: Secondary | ICD-10-CM | POA: Diagnosis not present

## 2019-12-05 ENCOUNTER — Ambulatory Visit (INDEPENDENT_AMBULATORY_CARE_PROVIDER_SITE_OTHER): Payer: Medicare Other | Admitting: Otolaryngology

## 2019-12-05 ENCOUNTER — Other Ambulatory Visit: Payer: Self-pay

## 2019-12-05 VITALS — Temp 96.8°F

## 2019-12-05 DIAGNOSIS — H6123 Impacted cerumen, bilateral: Secondary | ICD-10-CM

## 2019-12-05 DIAGNOSIS — H903 Sensorineural hearing loss, bilateral: Secondary | ICD-10-CM

## 2019-12-05 NOTE — Progress Notes (Signed)
HPI: Kevin Mata is a 86 y.o. male who presents for evaluation of blockage of his hearing or ear on the right side.  He has  been having some intermittent headaches he has also had some dizziness recently or swimmy headedness and underwent an MRI scan a couple of months ago that was reported as normal.  He does not have any nasal congestion or trouble breathing through his nose.  No yellow-green discharge from his nose.  He has had problems with wax previously and was last seen about 2 and half years ago.  Past Medical History:  Diagnosis Date  . Anxiety   . GERD (gastroesophageal reflux disease)   . Thyroid disease    No past surgical history on file. Social History   Socioeconomic History  . Marital status: Married    Spouse name: Not on file  . Number of children: 1  . Years of education: Not on file  . Highest education level: Not on file  Occupational History  . Not on file  Tobacco Use  . Smoking status: Former Smoker    Types: Cigarettes    Quit date: 08/04/2015    Years since quitting: 4.3  . Smokeless tobacco: Current User    Types: Chew  Vaping Use  . Vaping Use: Never used  Substance and Sexual Activity  . Alcohol use: Never  . Drug use: Never  . Sexual activity: Not on file  Other Topics Concern  . Not on file  Social History Narrative   One living child, one deceased child.    Social Determinants of Health   Financial Resource Strain:   . Difficulty of Paying Living Expenses:   Food Insecurity:   . Worried About Programme researcher, broadcasting/film/video in the Last Year:   . Barista in the Last Year:   Transportation Needs:   . Freight forwarder (Medical):   Marland Kitchen Lack of Transportation (Non-Medical):   Physical Activity:   . Days of Exercise per Week:   . Minutes of Exercise per Session:   Stress:   . Feeling of Stress :   Social Connections:   . Frequency of Communication with Friends and Family:   . Frequency of Social Gatherings with Friends and Family:    . Attends Religious Services:   . Active Member of Clubs or Organizations:   . Attends Banker Meetings:   Marland Kitchen Marital Status:    Family History  Problem Relation Age of Onset  . Dementia Mother    Allergies  Allergen Reactions  . Penicillins     Hives and swelling  . Sulfur    Prior to Admission medications   Medication Sig Start Date End Date Taking? Authorizing Provider  clonazePAM (KLONOPIN) 0.5 MG tablet Take 0.5 mg by mouth 2 (two) times daily as needed for anxiety.    [provider]  levothyroxine (SYNTHROID) 100 MCG tablet Take 75 mcg by mouth daily before breakfast.     [provider]  omeprazole (PRILOSEC) 20 MG capsule Take 20 mg by mouth daily.    [provider]  traMADol (ULTRAM) 50 MG tablet Take 50 mg by mouth as needed.  06/19/19   [provider]     Positive ROS: Otherwise negative  All other systems have been reviewed and were otherwise negative with the exception of those mentioned in the HPI and as above.  Physical Exam: Constitutional: Alert, well-appearing, no acute distress Ears: External ears without lesions or  tenderness.  He has large amount of wax in both ear canals right side worse than left.  This was cleaned with curettes suction and forceps.  TMs were clear bilaterally.  Following cleaning the ear canals hearing was symmetric with tuning fork testing although he had moderate sensorineural hearing loss in both ears which was symmetric.  On Dix-Hallpike testing no evidence of BPPV. Nasal: External nose without lesions. Septum with minimal deformity.  Clear nasal passages.  Both middle meatus regions were clear.  No signs of infection no polyps.. Oral: Lips and gums without lesions. Tongue and palate mucosa without lesions. Posterior oropharynx clear. Neck: No palpable adenopathy or masses Respiratory: Breathing comfortably  Skin: No facial/neck lesions or rash noted.  Cerumen impaction  removal  Date/Time: 12/05/2019 2:04 PM Performed by: Drema Halon, MD Authorized by: Drema Halon, MD   Consent:    Consent obtained:  Verbal   Consent given by:  Patient   Risks discussed:  Pain and bleeding Procedure details:    Location:  L ear and R ear   Procedure type: curette, suction and forceps   Post-procedure details:    Inspection:  TM intact and canal normal   Hearing quality:  Improved   Patient tolerance of procedure:  Tolerated well, no immediate complications Comments:     TMs are clear bilaterally.    Assessment: Cerumen buildup causing the hearing fullness on the right side. Bilateral underlying SNHL. Clear nasal sinus exam.  Plan: Discussed with him concerning possible hearing aid use as he does have some difficulty hearing adequately.  Would recommend obtaining audiologic testing and hearing aids if he is up to this.  Narda Bonds, MD

## 2019-12-29 DIAGNOSIS — R634 Abnormal weight loss: Secondary | ICD-10-CM | POA: Diagnosis not present

## 2019-12-29 DIAGNOSIS — I951 Orthostatic hypotension: Secondary | ICD-10-CM | POA: Diagnosis not present

## 2019-12-29 DIAGNOSIS — E46 Unspecified protein-calorie malnutrition: Secondary | ICD-10-CM | POA: Diagnosis not present

## 2019-12-29 DIAGNOSIS — R519 Headache, unspecified: Secondary | ICD-10-CM | POA: Diagnosis not present

## 2020-02-06 DIAGNOSIS — J84112 Idiopathic pulmonary fibrosis: Secondary | ICD-10-CM | POA: Diagnosis not present

## 2020-02-06 DIAGNOSIS — R918 Other nonspecific abnormal finding of lung field: Secondary | ICD-10-CM | POA: Diagnosis not present

## 2020-02-22 DIAGNOSIS — F419 Anxiety disorder, unspecified: Secondary | ICD-10-CM | POA: Diagnosis not present

## 2020-02-22 DIAGNOSIS — R918 Other nonspecific abnormal finding of lung field: Secondary | ICD-10-CM | POA: Diagnosis not present

## 2020-02-22 DIAGNOSIS — E039 Hypothyroidism, unspecified: Secondary | ICD-10-CM | POA: Diagnosis not present

## 2020-02-22 DIAGNOSIS — J449 Chronic obstructive pulmonary disease, unspecified: Secondary | ICD-10-CM | POA: Diagnosis not present

## 2020-02-27 ENCOUNTER — Telehealth: Payer: Self-pay | Admitting: Internal Medicine

## 2020-02-27 NOTE — Telephone Encounter (Signed)
I received a new pt referral from Dr. Thornell Mule for the pt to be seen in [facility] for lung nodule. When I cld to schedule, pt's wife stated that the pt isn't mentally or physically ready to attend any appts at this time. They will cb once the pt is ready. I updated the thoracic navigator.

## 2020-02-29 ENCOUNTER — Encounter: Payer: Medicare Other | Admitting: Thoracic Surgery (Cardiothoracic Vascular Surgery)

## 2020-03-04 ENCOUNTER — Encounter: Payer: Medicare Other | Admitting: Thoracic Surgery (Cardiothoracic Vascular Surgery)

## 2020-03-19 ENCOUNTER — Encounter: Payer: Self-pay | Admitting: Cardiology

## 2020-03-19 ENCOUNTER — Other Ambulatory Visit: Payer: Self-pay

## 2020-03-19 ENCOUNTER — Ambulatory Visit: Payer: Medicare Other | Admitting: Cardiology

## 2020-03-19 VITALS — BP 142/89 | HR 85 | Resp 15 | Ht 75.0 in | Wt 144.0 lb

## 2020-03-19 DIAGNOSIS — I951 Orthostatic hypotension: Secondary | ICD-10-CM | POA: Diagnosis not present

## 2020-03-19 DIAGNOSIS — Z87891 Personal history of nicotine dependence: Secondary | ICD-10-CM

## 2020-03-19 DIAGNOSIS — I6523 Occlusion and stenosis of bilateral carotid arteries: Secondary | ICD-10-CM | POA: Diagnosis not present

## 2020-03-19 DIAGNOSIS — I4510 Unspecified right bundle-branch block: Secondary | ICD-10-CM | POA: Diagnosis not present

## 2020-03-19 DIAGNOSIS — E039 Hypothyroidism, unspecified: Secondary | ICD-10-CM

## 2020-03-19 MED ORDER — ASPIRIN EC 81 MG PO TBEC: 81.0000 mg | DELAYED_RELEASE_TABLET | Freq: Every day | ORAL | 0 refills | Status: AC

## 2020-03-19 MED ORDER — ATORVASTATIN CALCIUM 10 MG PO TABS: 10.0000 mg | ORAL_TABLET | Freq: Every day | ORAL | 1 refills | Status: AC

## 2020-03-19 NOTE — Progress Notes (Signed)
° °Kevin Mata °Date of Birth: 05/04/1933 °MRN: [id number] °Primary Care Provider:Skakle, [place], DO °Former Cardiology Providers: None °Primary Cardiologist:  , DO, FACC (08/04/2019) ° °Date: 03/19/20 °Last Office Visit: 09/15/2019 ° °Chief Complaint  °Patient presents with  °• Follow-up  °  6 month  °• Orthostatic hypotension  °• Carotid disease  ° °HPI  °Kevin Mata is a 87 y.o. male who presents to the office with a chief complaint of "follow-up on orthostatic hypotension and management of chronic disease." Patient's past medical history and cardiac risk factors include: Hypothyroidism, GERD, advanced age, asymptomatic carotid artery atherosclerosis, former smoker ° °Patient was referred to the office at the request of his primary care physician in March 2021 regarding evaluation of orthostatic hypotension.  He is accompanied by his wife at today's office visit. ° °Since last office visit patient states that his lightheadedness and dizziness have improved significantly.  He has implemented lifestyle changes as discussed.  He states that the lightheadedness can surface if he changes positions too quickly.  ° °He underwent an echocardiogram which illustrates preserved left ventricular systolic function.  Additional details noted below for further reference. ° °Bilateral carotid arteries demonstrate less than 50% stenosis.  At the last office visit we discussed initiating aspirin and statin therapy.  At that time patient wanted some time to think about it.  Since last office visit patient states that he is willing to start aspirin and statin therapy. ° °Denies prior history of coronary artery disease, myocardial infarction, congestive heart failure, deep venous thrombosis, pulmonary embolism, stroke, transient ischemic attack.  No history of premature coronary disease in the family or sudden cardiac death. ° °ALLERGIES: °Allergies  °Allergen Reactions  °• Penicillins   °  Hives and swelling  °•  Sulfur   ° °MEDICATION LIST PRIOR TO VISIT: °Current Outpatient Medications on File Prior to Visit  °Medication Sig Dispense Refill  °• clonazePAM (KLONOPIN) 0.5 MG tablet Take 0.5 mg by mouth 2 (two) times daily as needed for anxiety.    °• levothyroxine (SYNTHROID) 75 MCG tablet Take 75 mcg by mouth as directed. Take 1/2 tablet Monday, 1/2 tablet Tuesday, 1 tablet all other days, per pt.    °• traMADol (ULTRAM) 50 MG tablet Take 50 mg by mouth as needed.     °• levothyroxine (SYNTHROID) 100 MCG tablet Take 75 mcg by mouth daily before breakfast.  (Patient not taking: Reported on 03/19/2020)    °• omeprazole (PRILOSEC) 20 MG capsule Take 20 mg by mouth daily. (Patient not taking: Reported on 03/19/2020)    ° °No current facility-administered medications on file prior to visit.  ° ° °PAST MEDICAL HISTORY: °Past Medical History:  °Diagnosis Date  °• Anxiety   °• Carotid artery stenosis, unilateral, left   °• GERD (gastroesophageal reflux disease)   °• Thyroid disease   ° ° °PAST SURGICAL HISTORY: °History reviewed. No pertinent surgical history. ° °FAMILY HISTORY: °The patient family history includes Dementia in his mother. °  °SOCIAL HISTORY:  °The patient  reports that he quit smoking about 4 years ago. His smoking use included cigarettes. His smokeless tobacco use includes chew. He reports that he does not drink alcohol and does not use drugs. ° °Review of Systems  °Constitutional: Negative for chills and fever.  °HENT: Negative for ear discharge, ear pain and nosebleeds.   °Eyes: Negative for blurred vision and discharge.  °Cardiovascular: Negative for chest pain, claudication, dyspnea on exertion, leg swelling, near-syncope, orthopnea, palpitations, paroxysmal nocturnal dyspnea   dyspnea and syncope.  Respiratory: Negative for cough and shortness of breath.   Endocrine: Negative for polydipsia, polyphagia and polyuria.  Hematologic/Lymphatic: Negative for bleeding problem.  Skin: Negative for flushing and nail  changes.  Musculoskeletal: Negative for muscle cramps, muscle weakness and myalgias.  Gastrointestinal: Negative for abdominal pain, dysphagia, hematemesis, hematochezia, melena, nausea and vomiting.  Neurological: Positive for light-headedness (chronic and stable). Negative for dizziness and focal weakness.    PHYSICAL EXAM: Vitals:   03/19/20 1047 03/19/20 1051 03/19/20 1053  BP: (!) 142/89    Pulse: 85    Resp: 15    Height: 6' 3" (1.905 m)    Weight: 144 lb (65.3 kg)    SpO2: 100% 100% 100%  BMI (Calculated): 18      Orthostatic VS for the past 72 hrs (Last 3 readings):  Orthostatic BP Patient Position BP Location Cuff Size Orthostatic Pulse  03/19/20 1053 137/81 Standing Left Arm Normal 85  03/19/20 1052 147/81 Sitting Left Arm Normal 67  03/19/20 1051 151/81 Supine Left Arm Normal 60   CONSTITUTIONAL: Well-developed and well-nourished. No acute distress.  SKIN: Skin is warm and dry. No rash noted. No cyanosis. No pallor. No jaundice HEAD: Normocephalic and atraumatic.   EYES: No scleral icterus MOUTH/THROAT: Moist oral membranes.  NECK: No JVD present. No thyromegaly noted.  Soft carotid bruit on the right side. LYMPHATIC: No visible cervical adenopathy.  CHEST Normal respiratory effort. No intercostal retractions  LUNGS: Clear to auscultation bilaterally.  No stridor. No wheezes. No rales.  CARDIOVASCULAR: Regular rate and rhythm, positive X1-D5, soft holosystolic murmur heard at the left sternal border, no gallops or rubs appreciated. ABDOMINAL: Not obese, soft, nontender not distended, positive bowel sounds in all 4 quadrants, no abdominal bruits appreciated.  No apparent ascites.  EXTREMITIES: No peripheral edema.  Warm to touch. HEMATOLOGIC: No significant bruising NEUROLOGIC: Oriented to person, place, and time. Nonfocal. Normal muscle tone.  PSYCHIATRIC: Normal mood and affect. Normal behavior. Cooperative  CARDIAC DATABASE: EKG: 08/04/2019: Normal sinus rhythm  with a ventricular rate of 81 bpm, right bundle branch block, right axis deviation, no obvious evidence of underlying ischemia or injury pattern.  Echocardiogram: 08/16/2019: 1. Normal LV systolic function with visual EF 60-65%. Left ventricle cavity is normal in size. Normal wall thickness. Normal global wall motion. Normal diastolic filling pattern. No obvious regional wall motion abnormalities. Calculated EF 60%. 2. Aortic sclerosis without stenosis. Trace aortic regurgitation. 3. Mild mitral valve leaflet thickening. Normal mitral valve leaflet mobility. No evidence of mitral valve stenosis. Trace mitral regurgitation. 4. Normal tricuspid valve leaflet mobility. Mild tricuspid valve leaflet thickening. No evidence of tricuspid stenosis. Moderate tricuspid regurgitation.  Stress Testing:  None  Heart Catheterization: None  48 hour Holter monitor August 04, 2019-August 06, 2019: Dominant rhythm normal sinus rhythm, followed by sinus bradycardia (25%). Heart rate 49 (1:16 AM)-142 bpm.  Average heart rate 69 bpm. No atrial fibrillation/atrial flutter/ventricular tachycardia/high grade AV block, sinus pause greater than or equal to 3 seconds in duration. Patient had six episodes of supraventricular tachycardia with the longest duration being 9 beats, at 8:54 AM, and the fastest episode was 142 bpm at 6:38 AM. Total ventricular ectopic burden <1%. Total supraventricular ectopic burden <1%. Number of patient triggered events: 3.  No symptoms documented. These did not correlate with any significant dysrhythmias  Carotid artery duplex  08/16/2019: Duplex suggests stenosis in the right common carotid artery (<50%). Duplex suggests stenosis in the left internal carotid artery (16-49%). Left CCA  stenosis of <50% Antegrade right vertebral artery flow. Antegrade left vertebral artery flow. Follow up in one year is appropriate if clinically indicated.  LABORATORY DATA: External Labs: Collected:  07/27/2019 Creatinine 1.1 mg/dL. eGFR: 63.8 mL/min per 1.73 m TSH: 0.64  FINAL MEDICATION LIST END OF ENCOUNTER: Meds ordered this encounter  Medications   atorvastatin (LIPITOR) 10 MG tablet    Sig: Take 1 tablet (10 mg total) by mouth at bedtime.    Dispense:  90 tablet    Refill:  1   aspirin EC 81 MG tablet    Sig: Take 1 tablet (81 mg total) by mouth daily for 90 doses. Swallow whole.    Dispense:  90 tablet    Refill:  0    There are no discontinued medications.   Current Outpatient Medications:    clonazePAM (KLONOPIN) 0.5 MG tablet, Take 0.5 mg by mouth 2 (two) times daily as needed for anxiety., Disp: , Rfl:    levothyroxine (SYNTHROID) 75 MCG tablet, Take 75 mcg by mouth as directed. Take 1/2 tablet Monday, 1/2 tablet Tuesday, 1 tablet all other days, per pt., Disp: , Rfl:    traMADol (ULTRAM) 50 MG tablet, Take 50 mg by mouth as needed. , Disp: , Rfl:    aspirin EC 81 MG tablet, Take 1 tablet (81 mg total) by mouth daily for 90 doses. Swallow whole., Disp: 90 tablet, Rfl: 0   atorvastatin (LIPITOR) 10 MG tablet, Take 1 tablet (10 mg total) by mouth at bedtime., Disp: 90 tablet, Rfl: 1   levothyroxine (SYNTHROID) 100 MCG tablet, Take 75 mcg by mouth daily before breakfast.  (Patient not taking: Reported on 03/19/2020), Disp: , Rfl:    omeprazole (PRILOSEC) 20 MG capsule, Take 20 mg by mouth daily. (Patient not taking: Reported on 03/19/2020), Disp: , Rfl:   IMPRESSION:    ICD-10-CM   1. Orthostatic hypotension  I95.1   2. Atherosclerosis of both carotid arteries  I65.23 CMP14+EGFR    Lipid Panel With LDL/HDL Ratio    atorvastatin (LIPITOR) 10 MG tablet    aspirin EC 81 MG tablet  3. RBBB (right bundle branch block)  I45.10   4. Former smoker  Z87.891   5. Hypothyroidism, unspecified type  E03.9      RECOMMENDATIONS: Kevin Mata is a 86 y.o. male whose past medical history and cardiac risk factors include: Hypothyroidism, GERD, advanced age,  asymptomatic carotid artery atherosclerosis, former smoker.  Orthostatic hypotension:    Encouraged to change position slowly and he is asymptomatic.   Patient has implemented lifestyle changes as discussed before.   We discussed standard patient education:   Pay attention to premonitory symptoms.  Take evasive action when premonitory symptoms occur; preferably lay down if possible.  If unable to lay down consider using the ankle cross leg squeeze maneuver.  We spent some time teaching and discussing the ankle cross leg squeeze maneuver today.  Keep well-hydrated.  Allow salt in the diet.  Encourage stand slowly.  Using compression stockings as needed.  Atherosclerosis of both carotid arteries: Follow up study ordered for March 2022. Start Aspirin 49m po qday and Lipitor 167mpo qHs.  We will check baseline CMP and lipid profile.  Medication profile discussed with the patient and his wife at today's visit.  Right bundle branch block: Suggested to be chronic and stable present on prior EKGs.  Continue to monitor.  Former smoker: Educated on the importance of continued smoking cessation.  --Continue cardiac medications as reconciled  final medication list. °--Return in about 5 months (around 08/30/2020) for Reevaluation of carotid studies and symptoms. .. Or sooner if needed. °--Continue follow-up with your primary care physician regarding the management of your other chronic comorbid conditions. ° °Patient's questions and concerns were addressed to his satisfaction. He voices understanding of the instructions provided during this encounter.  ° °This note was created using a voice recognition software as a result there may be grammatical errors inadvertently enclosed that do not reflect the nature of this encounter. Every attempt is made to correct such errors. ° ° , DO, FACC ° °Pager: [phone] °Office: [phone] ° ° °

## 2020-03-20 DIAGNOSIS — I6523 Occlusion and stenosis of bilateral carotid arteries: Secondary | ICD-10-CM | POA: Diagnosis not present

## 2020-03-21 ENCOUNTER — Telehealth: Payer: Self-pay

## 2020-03-21 ENCOUNTER — Other Ambulatory Visit: Payer: Self-pay | Admitting: Cardiology

## 2020-03-21 DIAGNOSIS — I6523 Occlusion and stenosis of bilateral carotid arteries: Secondary | ICD-10-CM

## 2020-03-21 LAB — CMP14+EGFR
ALT: 8 IU/L (ref 0–44)
AST: 16 IU/L (ref 0–40)
Albumin/Globulin Ratio: 1.6 (ref 1.2–2.2)
Albumin: 4.4 g/dL (ref 3.6–4.6)
Alkaline Phosphatase: 78 IU/L (ref 44–121)
BUN/Creatinine Ratio: 9 — ABNORMAL LOW (ref 10–24)
BUN: 10 mg/dL (ref 8–27)
Bilirubin Total: 0.4 mg/dL (ref 0.0–1.2)
CO2: 27 mmol/L (ref 20–29)
Calcium: 9.8 mg/dL (ref 8.6–10.2)
Chloride: 98 mmol/L (ref 96–106)
Creatinine, Ser: 1.14 mg/dL (ref 0.76–1.27)
GFR calc Af Amer: 67 mL/min/{1.73_m2} (ref >59)
GFR calc non Af Amer: 58 mL/min/{1.73_m2} — ABNORMAL LOW (ref >59)
Globulin, Total: 2.7 g/dL (ref 1.5–4.5)
Glucose: 107 mg/dL — ABNORMAL HIGH (ref 65–99)
Potassium: 4.4 mmol/L (ref 3.5–5.2)
Sodium: 135 mmol/L (ref 134–144)
Total Protein: 7.1 g/dL (ref 6.0–8.5)

## 2020-03-21 LAB — LIPID PANEL WITH LDL/HDL RATIO
Cholesterol, Total: 213 mg/dL — ABNORMAL HIGH (ref 100–199)
HDL: 47 mg/dL (ref >39)
LDL Chol Calc (NIH): 147 mg/dL — ABNORMAL HIGH (ref 0–99)
LDL/HDL Ratio: 3.1 ratio (ref 0.0–3.6)
Triglycerides: 104 mg/dL (ref 0–149)
VLDL Cholesterol Cal: 19 mg/dL (ref 5–40)

## 2020-03-21 NOTE — Telephone Encounter (Signed)
Relayed information to patient and spouse regarding medication and labs. Both voiced understanding and will start Lipitor as prescribed. Will have blood work in 4 weeks. No further questions at this time.

## 2020-03-21 NOTE — Telephone Encounter (Signed)
-----   Message from [place], [place] sent at 03/21/2020  9:27 AM EDT ----- Baseline CMP and Lipid Panel reviewed.  Start Lipitor as prescribed at the last visit due to carotid disease.  Repeat lipid profile prior in 6 weeks.   Please have him schedule followup visit on or around 08/30/2020.  ST

## 2020-03-21 NOTE — Telephone Encounter (Signed)
-----   Message from Sunit Tolia, DO sent at 03/21/2020  9:27 AM EDT ----- °Baseline CMP and Lipid Panel reviewed.  °Start Lipitor as prescribed at the last visit due to carotid disease.  °Repeat lipid profile prior in 6 weeks.  ° °Please have him schedule followup visit on or around 08/30/2020. ° °ST °

## 2020-03-21 NOTE — Progress Notes (Signed)
Unable to reach patient. Left vm to cb.

## 2020-03-21 NOTE — Telephone Encounter (Signed)
Unable to reach patient regarding results. Left vm to cb.

## 2020-03-21 NOTE — Progress Notes (Signed)
Done

## 2020-04-02 ENCOUNTER — Telehealth: Payer: Self-pay

## 2020-04-02 DIAGNOSIS — B029 Zoster without complications: Secondary | ICD-10-CM | POA: Diagnosis not present

## 2020-04-02 DIAGNOSIS — E785 Hyperlipidemia, unspecified: Secondary | ICD-10-CM | POA: Diagnosis not present

## 2020-04-02 NOTE — Telephone Encounter (Signed)
Patient called to inform us that he is having an allergic reaction from the new medication (Atorvastatin), pt mention he his have blister all over his arm and red spots. Pt would like for you to look at them.

## 2020-04-02 NOTE — Telephone Encounter (Signed)
Called pt to see if he was able to see his PCP or an urgent. Pt mention he saw he pcp and they told him he has shingles.

## 2020-04-02 NOTE — Telephone Encounter (Signed)
If he unable to see his PCP, I dont mind seeing him.  Also have him stop his statin medications.  Check up on him either later today or tomorrow.

## 2020-04-02 NOTE — Telephone Encounter (Signed)
Called pt back, pt mention he started his medication 10 days ago and the rash started 3 days ago. Informed pt to stop the medication and to go to his PCP or an urgent care clinic to have that checked out. Pt understood.

## 2020-04-12 DIAGNOSIS — B029 Zoster without complications: Secondary | ICD-10-CM | POA: Diagnosis not present

## 2020-04-12 DIAGNOSIS — F419 Anxiety disorder, unspecified: Secondary | ICD-10-CM | POA: Diagnosis not present

## 2020-04-12 DIAGNOSIS — M25511 Pain in right shoulder: Secondary | ICD-10-CM | POA: Diagnosis not present

## 2020-05-01 DIAGNOSIS — R911 Solitary pulmonary nodule: Secondary | ICD-10-CM | POA: Diagnosis not present

## 2020-05-01 DIAGNOSIS — E46 Unspecified protein-calorie malnutrition: Secondary | ICD-10-CM | POA: Diagnosis not present

## 2020-05-01 DIAGNOSIS — B029 Zoster without complications: Secondary | ICD-10-CM | POA: Diagnosis not present

## 2020-05-01 DIAGNOSIS — F419 Anxiety disorder, unspecified: Secondary | ICD-10-CM | POA: Diagnosis not present

## 2020-05-17 DIAGNOSIS — J84114 Acute interstitial pneumonitis: Secondary | ICD-10-CM | POA: Diagnosis not present

## 2020-05-17 DIAGNOSIS — M1119 Familial chondrocalcinosis, multiple sites: Secondary | ICD-10-CM | POA: Diagnosis not present

## 2020-05-17 DIAGNOSIS — M8589 Other specified disorders of bone density and structure, multiple sites: Secondary | ICD-10-CM | POA: Diagnosis not present

## 2020-05-17 DIAGNOSIS — M65221 Calcific tendinitis, right upper arm: Secondary | ICD-10-CM | POA: Diagnosis not present

## 2020-05-17 DIAGNOSIS — R918 Other nonspecific abnormal finding of lung field: Secondary | ICD-10-CM | POA: Diagnosis not present

## 2020-05-19 DIAGNOSIS — M25511 Pain in right shoulder: Secondary | ICD-10-CM | POA: Diagnosis not present

## 2020-08-13 DIAGNOSIS — M25511 Pain in right shoulder: Secondary | ICD-10-CM | POA: Diagnosis not present

## 2020-08-15 DIAGNOSIS — F419 Anxiety disorder, unspecified: Secondary | ICD-10-CM | POA: Diagnosis not present

## 2020-08-15 DIAGNOSIS — E039 Hypothyroidism, unspecified: Secondary | ICD-10-CM | POA: Diagnosis not present

## 2020-08-15 DIAGNOSIS — I700 Atherosclerosis of aorta: Secondary | ICD-10-CM | POA: Diagnosis not present

## 2020-08-15 DIAGNOSIS — E46 Unspecified protein-calorie malnutrition: Secondary | ICD-10-CM | POA: Diagnosis not present

## 2020-09-26 DIAGNOSIS — M25511 Pain in right shoulder: Secondary | ICD-10-CM | POA: Diagnosis not present

## 2020-10-04 DIAGNOSIS — M25511 Pain in right shoulder: Secondary | ICD-10-CM | POA: Diagnosis not present

## 2020-10-29 DIAGNOSIS — M25511 Pain in right shoulder: Secondary | ICD-10-CM | POA: Diagnosis not present

## 2020-11-12 DIAGNOSIS — Z125 Encounter for screening for malignant neoplasm of prostate: Secondary | ICD-10-CM | POA: Diagnosis not present

## 2020-11-12 DIAGNOSIS — Z0000 Encounter for general adult medical examination without abnormal findings: Secondary | ICD-10-CM | POA: Diagnosis not present

## 2020-11-12 DIAGNOSIS — E039 Hypothyroidism, unspecified: Secondary | ICD-10-CM | POA: Diagnosis not present

## 2020-11-12 DIAGNOSIS — E785 Hyperlipidemia, unspecified: Secondary | ICD-10-CM | POA: Diagnosis not present

## 2020-11-19 DIAGNOSIS — R82998 Other abnormal findings in urine: Secondary | ICD-10-CM | POA: Diagnosis not present

## 2020-11-19 DIAGNOSIS — F419 Anxiety disorder, unspecified: Secondary | ICD-10-CM | POA: Diagnosis not present

## 2020-11-19 DIAGNOSIS — E039 Hypothyroidism, unspecified: Secondary | ICD-10-CM | POA: Diagnosis not present

## 2020-11-19 DIAGNOSIS — E46 Unspecified protein-calorie malnutrition: Secondary | ICD-10-CM | POA: Diagnosis not present

## 2020-11-19 DIAGNOSIS — Z0000 Encounter for general adult medical examination without abnormal findings: Secondary | ICD-10-CM | POA: Diagnosis not present

## 2020-11-26 ENCOUNTER — Telehealth: Payer: Self-pay | Admitting: Hospice

## 2020-11-26 NOTE — Telephone Encounter (Signed)
Attempted to contact patient to offer to schedule an In-home Palliative Consult - no answer, left message with reason for call along with my name and call back number requesting a return call to schedule visit.

## 2020-12-06 ENCOUNTER — Telehealth: Payer: Self-pay

## 2020-12-06 NOTE — Telephone Encounter (Signed)
Attempted to contact patient to schedule a Palliative Care consult appointment. No answer left a message to return call.  

## 2020-12-10 DIAGNOSIS — L509 Urticaria, unspecified: Secondary | ICD-10-CM | POA: Diagnosis not present

## 2020-12-10 DIAGNOSIS — R911 Solitary pulmonary nodule: Secondary | ICD-10-CM | POA: Diagnosis not present

## 2020-12-10 DIAGNOSIS — R634 Abnormal weight loss: Secondary | ICD-10-CM | POA: Diagnosis not present

## 2020-12-13 ENCOUNTER — Telehealth: Payer: Self-pay

## 2020-12-13 NOTE — Telephone Encounter (Signed)
Attempted to contact patient/patient's wife Kevin Mata to schedule a Palliative Care consult appointment. No answer left a message to return call.

## 2020-12-23 ENCOUNTER — Telehealth: Payer: Self-pay

## 2020-12-23 NOTE — Telephone Encounter (Signed)
Third attempt to contact patient/patient's wife Kevin Mata to schedule a Palliative Care consult appointment. No answer left a message. If no return call by 7/29 will cancel referral and notify PCP.

## 2021-03-20 DIAGNOSIS — E46 Unspecified protein-calorie malnutrition: Secondary | ICD-10-CM | POA: Diagnosis not present

## 2021-03-20 DIAGNOSIS — Z23 Encounter for immunization: Secondary | ICD-10-CM | POA: Diagnosis not present

## 2021-03-20 DIAGNOSIS — F419 Anxiety disorder, unspecified: Secondary | ICD-10-CM | POA: Diagnosis not present

## 2021-03-20 DIAGNOSIS — E039 Hypothyroidism, unspecified: Secondary | ICD-10-CM | POA: Diagnosis not present

## 2021-03-20 DIAGNOSIS — I700 Atherosclerosis of aorta: Secondary | ICD-10-CM | POA: Diagnosis not present

## 2021-08-21 DIAGNOSIS — E039 Hypothyroidism, unspecified: Secondary | ICD-10-CM | POA: Diagnosis not present

## 2021-08-21 DIAGNOSIS — E785 Hyperlipidemia, unspecified: Secondary | ICD-10-CM | POA: Diagnosis not present

## 2021-08-21 DIAGNOSIS — F419 Anxiety disorder, unspecified: Secondary | ICD-10-CM | POA: Diagnosis not present

## 2021-08-21 DIAGNOSIS — E46 Unspecified protein-calorie malnutrition: Secondary | ICD-10-CM | POA: Diagnosis not present

## 2021-08-21 DIAGNOSIS — Z23 Encounter for immunization: Secondary | ICD-10-CM | POA: Diagnosis not present

## 2022-01-23 DIAGNOSIS — E039 Hypothyroidism, unspecified: Secondary | ICD-10-CM | POA: Diagnosis not present

## 2022-01-23 DIAGNOSIS — Z125 Encounter for screening for malignant neoplasm of prostate: Secondary | ICD-10-CM | POA: Diagnosis not present

## 2022-01-23 DIAGNOSIS — E785 Hyperlipidemia, unspecified: Secondary | ICD-10-CM | POA: Diagnosis not present

## 2022-01-23 DIAGNOSIS — F419 Anxiety disorder, unspecified: Secondary | ICD-10-CM | POA: Diagnosis not present

## 2022-01-30 DIAGNOSIS — E039 Hypothyroidism, unspecified: Secondary | ICD-10-CM | POA: Diagnosis not present

## 2022-01-30 DIAGNOSIS — R82998 Other abnormal findings in urine: Secondary | ICD-10-CM | POA: Diagnosis not present

## 2022-01-30 DIAGNOSIS — Z0000 Encounter for general adult medical examination without abnormal findings: Secondary | ICD-10-CM | POA: Diagnosis not present

## 2022-01-30 DIAGNOSIS — F419 Anxiety disorder, unspecified: Secondary | ICD-10-CM | POA: Diagnosis not present

## 2022-01-30 DIAGNOSIS — E46 Unspecified protein-calorie malnutrition: Secondary | ICD-10-CM | POA: Diagnosis not present

## 2022-08-06 DIAGNOSIS — R972 Elevated prostate specific antigen [PSA]: Secondary | ICD-10-CM | POA: Diagnosis not present

## 2022-08-06 DIAGNOSIS — E039 Hypothyroidism, unspecified: Secondary | ICD-10-CM | POA: Diagnosis not present

## 2022-08-06 DIAGNOSIS — F419 Anxiety disorder, unspecified: Secondary | ICD-10-CM | POA: Diagnosis not present

## 2022-08-06 DIAGNOSIS — N390 Urinary tract infection, site not specified: Secondary | ICD-10-CM | POA: Diagnosis not present

## 2022-08-06 DIAGNOSIS — I700 Atherosclerosis of aorta: Secondary | ICD-10-CM | POA: Diagnosis not present

## 2022-08-06 DIAGNOSIS — R300 Dysuria: Secondary | ICD-10-CM | POA: Diagnosis not present

## 2022-08-06 DIAGNOSIS — E46 Unspecified protein-calorie malnutrition: Secondary | ICD-10-CM | POA: Diagnosis not present

## 2022-08-10 DIAGNOSIS — Z0000 Encounter for general adult medical examination without abnormal findings: Secondary | ICD-10-CM | POA: Diagnosis not present

## 2022-08-11 DIAGNOSIS — L89102 Pressure ulcer of unspecified part of back, stage 2: Secondary | ICD-10-CM | POA: Diagnosis not present

## 2022-08-11 DIAGNOSIS — E871 Hypo-osmolality and hyponatremia: Secondary | ICD-10-CM | POA: Diagnosis not present

## 2022-08-11 DIAGNOSIS — E46 Unspecified protein-calorie malnutrition: Secondary | ICD-10-CM | POA: Diagnosis not present

## 2022-08-11 DIAGNOSIS — L98429 Non-pressure chronic ulcer of back with unspecified severity: Secondary | ICD-10-CM | POA: Diagnosis not present

## 2022-08-13 DIAGNOSIS — E781 Pure hyperglyceridemia: Secondary | ICD-10-CM | POA: Diagnosis not present

## 2022-09-25 DIAGNOSIS — Z681 Body mass index (BMI) 19 or less, adult: Secondary | ICD-10-CM | POA: Diagnosis not present

## 2022-09-25 DIAGNOSIS — E039 Hypothyroidism, unspecified: Secondary | ICD-10-CM | POA: Diagnosis not present

## 2022-09-25 DIAGNOSIS — K219 Gastro-esophageal reflux disease without esophagitis: Secondary | ICD-10-CM | POA: Diagnosis not present

## 2022-09-25 DIAGNOSIS — I10 Essential (primary) hypertension: Secondary | ICD-10-CM | POA: Diagnosis not present

## 2022-09-25 DIAGNOSIS — B029 Zoster without complications: Secondary | ICD-10-CM | POA: Diagnosis not present

## 2022-09-25 DIAGNOSIS — J439 Emphysema, unspecified: Secondary | ICD-10-CM | POA: Diagnosis not present

## 2022-09-25 DIAGNOSIS — L89152 Pressure ulcer of sacral region, stage 2: Secondary | ICD-10-CM | POA: Diagnosis not present

## 2022-09-25 DIAGNOSIS — L89512 Pressure ulcer of right ankle, stage 2: Secondary | ICD-10-CM | POA: Diagnosis not present

## 2022-09-25 DIAGNOSIS — F419 Anxiety disorder, unspecified: Secondary | ICD-10-CM | POA: Diagnosis not present

## 2022-09-25 DIAGNOSIS — M629 Disorder of muscle, unspecified: Secondary | ICD-10-CM | POA: Diagnosis not present

## 2022-09-25 DIAGNOSIS — I361 Nonrheumatic tricuspid (valve) insufficiency: Secondary | ICD-10-CM | POA: Diagnosis not present

## 2022-09-25 DIAGNOSIS — Z993 Dependence on wheelchair: Secondary | ICD-10-CM | POA: Diagnosis not present

## 2022-09-25 DIAGNOSIS — E46 Unspecified protein-calorie malnutrition: Secondary | ICD-10-CM | POA: Diagnosis not present

## 2022-09-29 ENCOUNTER — Emergency Department (HOSPITAL_COMMUNITY): Payer: Medicare Other

## 2022-09-29 ENCOUNTER — Emergency Department (HOSPITAL_COMMUNITY)
Admission: EM | Admit: 2022-09-29 | Discharge: 2022-09-29 | Disposition: A | Discharge status: Home / Self Care | Payer: Medicare Other | Attending: Emergency Medicine | Admitting: Emergency Medicine

## 2022-09-29 ENCOUNTER — Encounter (HOSPITAL_COMMUNITY): Payer: Self-pay | Admitting: Emergency Medicine

## 2022-09-29 ENCOUNTER — Other Ambulatory Visit: Payer: Self-pay

## 2022-09-29 DIAGNOSIS — R1909 Other intra-abdominal and pelvic swelling, mass and lump: Secondary | ICD-10-CM | POA: Diagnosis not present

## 2022-09-29 DIAGNOSIS — Z79890 Hormone replacement therapy (postmenopausal): Secondary | ICD-10-CM | POA: Insufficient documentation

## 2022-09-29 DIAGNOSIS — E039 Hypothyroidism, unspecified: Secondary | ICD-10-CM | POA: Insufficient documentation

## 2022-09-29 DIAGNOSIS — R1031 Right lower quadrant pain: Secondary | ICD-10-CM

## 2022-09-29 DIAGNOSIS — I2510 Atherosclerotic heart disease of native coronary artery without angina pectoris: Secondary | ICD-10-CM | POA: Diagnosis not present

## 2022-09-29 DIAGNOSIS — I70201 Unspecified atherosclerosis of native arteries of extremities, right leg: Secondary | ICD-10-CM | POA: Diagnosis not present

## 2022-09-29 DIAGNOSIS — R609 Edema, unspecified: Secondary | ICD-10-CM | POA: Diagnosis not present

## 2022-09-29 DIAGNOSIS — R1030 Lower abdominal pain, unspecified: Secondary | ICD-10-CM | POA: Diagnosis not present

## 2022-09-29 LAB — CBC WITH DIFFERENTIAL/PLATELET
Abs Immature Granulocytes: 0.05 10*3/uL (ref 0.00–0.07)
Basophils Absolute: 0.1 10*3/uL (ref 0.0–0.1)
Basophils Relative: 1 %
Eosinophils Absolute: 0.1 10*3/uL (ref 0.0–0.5)
Eosinophils Relative: 1 %
HCT: 34.6 % — ABNORMAL LOW (ref 39.0–52.0)
Hemoglobin: 11.2 g/dL — ABNORMAL LOW (ref 13.0–17.0)
Immature Granulocytes: 1 %
Lymphocytes Relative: 14 %
Lymphs Abs: 1.1 10*3/uL (ref 0.7–4.0)
MCH: 29.5 pg (ref 26.0–34.0)
MCHC: 32.4 g/dL (ref 30.0–36.0)
MCV: 91.1 fL (ref 80.0–100.0)
Monocytes Absolute: 0.5 10*3/uL (ref 0.1–1.0)
Monocytes Relative: 6 %
Neutro Abs: 6.2 10*3/uL (ref 1.7–7.7)
Neutrophils Relative %: 77 %
Platelets: 388 10*3/uL (ref 150–400)
RBC: 3.8 MIL/uL — ABNORMAL LOW (ref 4.22–5.81)
RDW: 12.2 % (ref 11.5–15.5)
WBC: 8 10*3/uL (ref 4.0–10.5)
nRBC: 0 % (ref 0.0–0.2)

## 2022-09-29 LAB — URINALYSIS, ROUTINE W REFLEX MICROSCOPIC
Bilirubin Urine: NEGATIVE
Glucose, UA: NEGATIVE mg/dL
Hgb urine dipstick: NEGATIVE
Ketones, ur: NEGATIVE mg/dL
Leukocytes,Ua: NEGATIVE
Nitrite: NEGATIVE
Protein, ur: NEGATIVE mg/dL
Specific Gravity, Urine: 1.008 (ref 1.005–1.030)
pH: 7 (ref 5.0–8.0)

## 2022-09-29 LAB — COMPREHENSIVE METABOLIC PANEL
ALT: 9 U/L (ref 0–44)
AST: 15 U/L (ref 15–41)
Albumin: 3.2 g/dL — ABNORMAL LOW (ref 3.5–5.0)
Alkaline Phosphatase: 85 U/L (ref 38–126)
Anion gap: 9 (ref 5–15)
BUN: 12 mg/dL (ref 8–23)
CO2: 27 mmol/L (ref 22–32)
Calcium: 9.1 mg/dL (ref 8.9–10.3)
Chloride: 91 mmol/L — ABNORMAL LOW (ref 98–111)
Creatinine, Ser: 0.97 mg/dL (ref 0.61–1.24)
GFR, Estimated: >60 mL/min (ref >60)
Glucose, Bld: 105 mg/dL — ABNORMAL HIGH (ref 70–99)
Potassium: 3.7 mmol/L (ref 3.5–5.1)
Sodium: 127 mmol/L — ABNORMAL LOW (ref 135–145)
Total Bilirubin: 0.9 mg/dL (ref 0.3–1.2)
Total Protein: 7.3 g/dL (ref 6.5–8.1)

## 2022-09-29 LAB — MAGNESIUM: Magnesium: 1.8 mg/dL (ref 1.7–2.4)

## 2022-09-29 MED ORDER — HYDROMORPHONE HCL 1 MG/ML IJ SOLN
0.5000 mg | Freq: Once | INTRAMUSCULAR | Status: AC
  Administered 2022-09-29: 0.5 mg via INTRAVENOUS
  Filled 2022-09-29: qty 1

## 2022-09-29 MED ORDER — DICLOFENAC SODIUM 1 % EX GEL: 4.0000 g | Freq: Four times a day (QID) | CUTANEOUS | 0 refills | Status: AC

## 2022-09-29 MED ORDER — LACTATED RINGERS IV BOLUS
500.0000 mL | Freq: Once | INTRAVENOUS | Status: AC
  Administered 2022-09-29: 500 mL via INTRAVENOUS

## 2022-09-29 MED ORDER — OXYCODONE HCL 5 MG PO TABS: 2.5000 mg | ORAL_TABLET | ORAL | 0 refills | Status: AC | PRN

## 2022-09-29 MED ORDER — SODIUM CHLORIDE 0.9 % IV BOLUS
500.0000 mL | Freq: Once | INTRAVENOUS | Status: AC
  Administered 2022-09-29: 500 mL via INTRAVENOUS

## 2022-09-29 MED ORDER — MORPHINE SULFATE 15 MG PO TABS: 7.5000 mg | ORAL_TABLET | ORAL | 0 refills | Status: DC | PRN

## 2022-09-29 MED ORDER — IOHEXOL 350 MG/ML SOLN
100.0000 mL | Freq: Once | INTRAVENOUS | Status: AC | PRN
  Administered 2022-09-29: 100 mL via INTRAVENOUS

## 2022-09-29 NOTE — ED Provider Notes (Signed)
Received patient in turnover from Dr. Durwin Nora.  Please see their note for further details of Hx, PE.  Briefly patient is a 89 y.o. male with a Groin Pain .  Patient with a groin mass it has been there for months.  Plan to obtain CT imaging.  This is resulted and shows likely cancer.  Patient has known lung nodules that he has been hesitant to have biopsied.  I discussed results with him and he is not sure if he wants to have this biopsied now.  Encouraged him to follow-up with his oncologist in the office.  He tells me the pain at times is severe and he does not feel like his current pain regiment is helpful.  Will do a short course of narcotics.Adela Lank, Jesusita Oka, DO 09/29/22 [phone]

## 2022-09-29 NOTE — ED Triage Notes (Signed)
Patient presents form his MD office due to a mass on the groin. This mass has been growing over 2-3 weeks. Now the mass is so painful he has difficulty walking.   HX orthostatic hypotension  EMS vitals: 146/84 BP 74 HR 100 % SPO2 on room air

## 2022-09-29 NOTE — ED Provider Notes (Signed)
[place] EMERGENCY DEPARTMENT AT [facility] Provider Note   CSN: [id number] Arrival date & time: 09/29/22  1140     History  Chief Complaint  Patient presents with   Groin Pain    Kevin Mata is a 89 y.o. male.  HPI Patient presents for painful swelling in the groin.  Medical history includes hypothyroidism, GERD, CAD.  Area of swelling was first noticed several weeks ago.  It has increased in size since then.  He has been seeing his primary care doctor about this.  He was prescribed tramadol which she has been taking twice a day for pain.  Up until 4 days ago, he was able to ambulate.  Since that time, pain has become too severe to walk.  He describes the pain as present on the area of swelling and radiating down the anterior and medial aspects of his right leg.  Pain is described as sharp and stabbing.  He has not noticed any skin changes to the area of swelling or to the areas of his right leg that have been painful.  Pain is worsened with straightening his leg as well as with ambulation.  He has been transferring himself to wheelchairs and scooting a wheelchair around his home with his left leg.  He does live with his wife who has assisted him with mobility.  Due to the worsening symptoms, patient was advised to come to the ED by his PCP.    Home Medications Prior to Admission medications   Medication Sig Start Date End Date Taking? Authorizing Provider  atorvastatin (LIPITOR) 10 MG tablet Take 1 tablet (10 mg total) by mouth at bedtime. 03/19/20 09/15/20  Tolia, Sunit, DO  clonazePAM (KLONOPIN) 0.5 MG tablet Take 0.5 mg by mouth 2 (two) times daily as needed for anxiety.    [provider]  levothyroxine (SYNTHROID) 100 MCG tablet Take 75 mcg by mouth daily before breakfast.  Patient not taking: Reported on 03/19/2020    [provider]  levothyroxine (SYNTHROID) 75 MCG tablet Take 75 mcg by mouth as directed. Take 1/2 tablet Monday, 1/2 tablet  Tuesday, 1 tablet all other days, per pt. 01/01/20   [provider]  omeprazole (PRILOSEC) 20 MG capsule Take 20 mg by mouth daily. Patient not taking: Reported on 03/19/2020    [provider]  traMADol (ULTRAM) 50 MG tablet Take 50 mg by mouth as needed.  06/19/19   [provider]      Allergies    Elemental sulfur and Penicillins    Review of Systems   Review of Systems  Genitourinary:        Right-sided groin swelling  Musculoskeletal:  Positive for gait problem.  All other systems reviewed and are negative.   Physical Exam Updated Vital Signs BP (!) 125/91   Pulse 67   Temp (!) 97.4 F (36.3 C) (Oral)   Resp 18   SpO2 100%  Physical Exam Vitals and nursing note reviewed. Exam conducted with a chaperone present.  Constitutional:      General: He is not in acute distress.    Appearance: Normal appearance. He is well-developed. He is not ill-appearing, toxic-appearing or diaphoretic.  HENT:     Head: Normocephalic and atraumatic.     Right Ear: External ear normal.     Left Ear: External ear normal.     Nose: Nose normal.     Mouth/Throat:     Mouth: Mucous membranes are moist.  Eyes:  Extraocular Movements: Extraocular movements intact.     Conjunctiva/sclera: Conjunctivae normal.  Cardiovascular:     Rate and Rhythm: Normal rate and regular rhythm.  Pulmonary:     Effort: Pulmonary effort is normal. No respiratory distress.  Abdominal:     General: There is no distension.     Palpations: Abdomen is soft.     Tenderness: There is no abdominal tenderness.  Genitourinary:    Comments: Firm swelling in right groin area.  No overlying skin change. Musculoskeletal:     Cervical back: Normal range of motion and neck supple.     Comments: Right hip ROM limited by pain.  Skin:    General: Skin is warm and dry.     Coloration: Skin is not jaundiced or pale.  Neurological:     General: No focal deficit present.     Mental Status: He is  alert and oriented to person, place, and time.  Psychiatric:        Mood and Affect: Mood normal.        Behavior: Behavior normal.        Thought Content: Thought content normal.        Judgment: Judgment normal.     ED Results / Procedures / Treatments   Labs (all labs ordered are listed, but only abnormal results are displayed) Labs Reviewed  COMPREHENSIVE METABOLIC PANEL - Abnormal; Notable for the following components:      Result Value   Sodium 127 (*)    Chloride 91 (*)    Glucose, Bld 105 (*)    Albumin 3.2 (*)    All other components within normal limits  CBC WITH DIFFERENTIAL/PLATELET - Abnormal; Notable for the following components:   RBC 3.80 (*)    Hemoglobin 11.2 (*)    HCT 34.6 (*)    All other components within normal limits  MAGNESIUM  URINALYSIS, ROUTINE W REFLEX MICROSCOPIC    EKG None  Radiology No results found.  Procedures Procedures    Medications Ordered in ED Medications  HYDROmorphone (DILAUDID) injection 0.5 mg (0.5 mg Intravenous Given 09/29/22 1226)  lactated ringers bolus 500 mL (500 mLs Intravenous New Bag/Given 09/29/22 1226)  sodium chloride 0.9 % bolus 500 mL (500 mLs Intravenous New Bag/Given 09/29/22 1345)    ED Course/ Medical Decision Making/ A&P                             Medical Decision Making Amount and/or Complexity of Data Reviewed Labs: ordered. Radiology: ordered.  Risk Prescription drug management.   This patient presents to the ED for concern of groin pain and swelling, this involves an extensive number of treatment options, and is a complaint that carries with it a high risk of complications and morbidity.  The differential diagnosis includes inguinal hernia, neoplasm, lymphadenitis, aneurysm   Co morbidities that complicate the patient evaluation  hypothyroidism, GERD, CAD   Additional history obtained:  Additional history obtained from EMS External records from outside source obtained and reviewed  including EMR   Lab Tests:  I Ordered, and personally interpreted labs.  The pertinent results include: Hemoglobin has mildly decreased from 12 years ago.  Kidney function is normal.  He has mild hyponatremia and hypochloremia with otherwise normal electrolytes.  Urinalysis is pending at time of signout.   Imaging Studies ordered:  I ordered imaging studies including CTA with runoff I independently visualized and interpreted imaging which showed (pending at  time of signout) I agree with the radiologist interpretation   Cardiac Monitoring: / EKG:  The patient was maintained on a cardiac monitor.  I personally viewed and interpreted the cardiac monitored which showed an underlying rhythm of: Sinus rhythm  Problem List / ED Course / Critical interventions / Medication management  Patient presents for painful right groin swelling.  This has been present and worsening over the past 3 weeks.  He has been unable to walk due to the pain over the past 4 days.  On arrival, patient is alert and oriented.  On direct inspection of his painful area, there is a firm swelling in the right groin area.  This is below the area of the inguinal canal.  Physical exam findings not consistent with hernia.  There is no overlying skin change.  Patient was given Dilaudid for analgesia.  Lab work and CT imaging were ordered.  Imaging study pending at time of signout.  Care patient was signed out to oncoming ED provider. I ordered medication including Dilaudid for analgesia; IV fluids for hydration Reevaluation of the patient after these medicines showed that the patient improved I have reviewed the patients home medicines and have made adjustments as needed   Social Determinants of Health:  Has PCP        Final Clinical Impression(s) / ED Diagnoses Final diagnoses:  Right inguinal pain    Rx / DC Orders ED Discharge Orders     None         Gloris Manchester, MD 09/29/22 1503

## 2022-09-29 NOTE — Discharge Instructions (Addendum)
Use the gel as prescribed Also take tylenol 1000mg(2 extra strength) four times a day.   Then take the pain medicine if you feel like you need it. Narcotics do not help with the pain, they only make you care about it less.  You can become addicted to this, people may break into your house to steal it.  It will constipate you.  If you drive under the influence of this medicine you can get a DUI.    

## 2022-09-30 DIAGNOSIS — C7951 Secondary malignant neoplasm of bone: Secondary | ICD-10-CM | POA: Diagnosis not present

## 2022-09-30 DIAGNOSIS — C801 Malignant (primary) neoplasm, unspecified: Secondary | ICD-10-CM | POA: Diagnosis not present

## 2022-09-30 DIAGNOSIS — E46 Unspecified protein-calorie malnutrition: Secondary | ICD-10-CM | POA: Diagnosis not present

## 2022-09-30 DIAGNOSIS — L98429 Non-pressure chronic ulcer of back with unspecified severity: Secondary | ICD-10-CM | POA: Diagnosis not present

## 2022-10-01 ENCOUNTER — Other Ambulatory Visit (HOSPITAL_COMMUNITY): Payer: Self-pay | Admitting: Internal Medicine

## 2022-10-01 ENCOUNTER — Encounter: Payer: Self-pay | Admitting: Radiation Oncology

## 2022-10-01 DIAGNOSIS — C7951 Secondary malignant neoplasm of bone: Secondary | ICD-10-CM

## 2022-10-01 NOTE — Progress Notes (Signed)
I spoke with Dr. Thornell Mule, an internist at [facility] about this pt. the patient has a history of a lung nodule that he did not want to have further workup for several years ago, as well as development of significant pelvic pain with an elevated PSA test in January 2024.  These results are unable to be evaluated because they are not in the epic system however his physician states that he would like the patient to be evaluated for consideration of palliative radiation and or additional treatments but that the patient is also not interested in aggressive workup.  We discussed that we would be happy to still see the patient to discuss the possibilities of radiotherapy given that on his CT in April 2024 he has a large pelvic lesion.  I suggested a PET scan would be a very helpful data point to better clarify his overall disease burden which would be a good talking point for discussing possible therapies without tissue.  Dr. Thornell Mule will plan to order this as well as redraw PSA level.  One of our providers would be happy to meet with him in clinic to discuss this.     Osker Mason, PAC

## 2022-10-05 ENCOUNTER — Telehealth: Payer: Self-pay | Admitting: Radiation Oncology

## 2022-10-05 NOTE — Telephone Encounter (Signed)
I called the patient and his wife to clarify concerns about the ability for him to get to the hospital to have a PET scan. I tried to call so we could clarify what type of appointments he would be able to attend or perhaps be unable to attempt. We would be happy to see him if needed, but I suspect if he is unable to have a PET scan, a daily therapy in our department for 1-2 weeks might be quite difficult as well. I'll message Dr. Thornell Mule to copy him on this. If the patient is unable to proceed with scans or come for daily therapy, he may be proceeding with hospice based care instead per our prior discussion.

## 2022-10-12 ENCOUNTER — Telehealth: Payer: Self-pay | Admitting: Radiation Oncology

## 2022-10-12 NOTE — Telephone Encounter (Signed)
LVM for referring provider Dr. Acie Fredrickson nurse, Marchelle Folks, advising of the recent issues contacting pt and pt's wife advising that she cannot bring him to get PET scan. Provider c./b information.

## 2022-10-14 ENCOUNTER — Telehealth: Payer: Self-pay | Admitting: Radiation Oncology

## 2022-10-14 NOTE — Telephone Encounter (Signed)
LVM to schedule CON with Dr. Moody 

## 2022-10-14 NOTE — Telephone Encounter (Signed)
Received c/b from Kevin Mata. She advised she was able to speak with pt's wife and daughter about needing the PET and Dr. Thornell Mule ordering it. Appt was able to be made for PET 6/3.

## 2022-10-14 NOTE — Telephone Encounter (Signed)
LVM for nurse Lurena Joiner @ [facility] about this patient. Office has not returned call or contacted [place] via fax.

## 2022-10-19 ENCOUNTER — Telehealth: Payer: Self-pay | Admitting: Radiation Oncology

## 2022-10-19 NOTE — Telephone Encounter (Signed)
Called patient to schedule a consultation w. Dr. Mitzi Hansen. Patient's wife stated the patient was asleep and would give [place] a call back once he wakes up. Patient's wife also requested for the patient to be seen by Dr. Roselind Messier.

## 2022-10-20 ENCOUNTER — Telehealth: Payer: Self-pay | Admitting: Radiation Oncology

## 2022-10-20 NOTE — Telephone Encounter (Signed)
Called patient to schedule a consultation w. Dr. Kinard. No answer, LVM for a return call.  

## 2022-10-20 NOTE — Telephone Encounter (Signed)
LVM to schedule CON with Dr. Moody 

## 2022-10-21 DIAGNOSIS — E46 Unspecified protein-calorie malnutrition: Secondary | ICD-10-CM | POA: Diagnosis not present

## 2022-10-22 ENCOUNTER — Telehealth: Payer: Self-pay | Admitting: Radiation Oncology

## 2022-10-22 NOTE — Telephone Encounter (Signed)
Called patient to see if he would like to continue with scheduling a consult w. Dr. Roselind Messier. No answer, LVM for a return call.

## 2022-10-23 ENCOUNTER — Telehealth: Payer: Self-pay | Admitting: Radiation Oncology

## 2022-10-23 NOTE — Telephone Encounter (Signed)
Sent unable to contact letter 5/24.

## 2022-10-23 NOTE — Telephone Encounter (Signed)
Called patient to schedule a consultation w. Dr. Kinard. No answer, LVM for a return call.  

## 2022-10-28 ENCOUNTER — Telehealth: Payer: Self-pay | Admitting: Radiation Oncology

## 2022-10-28 NOTE — Telephone Encounter (Signed)
LVM with [facility], CMA Lurena Joiner, to c/b to update on pt's appt status and see if there has been any change since pt spoke with Dr. Thornell Mule.

## 2022-10-29 ENCOUNTER — Telehealth: Payer: Self-pay | Admitting: Radiation Oncology

## 2022-10-29 NOTE — Telephone Encounter (Signed)
Dr.Skakle's assistant, Lurena Joiner called to advise that Dr. Thornell Mule will be speaking to pt later today to make sure they are ok with proceeding with imaging. It was noted PET that was scheduled 6/3 was canceled due to no transportation.  It is recommended to coordinate visits with transportation coordinator to allow for transportation via ambulance due to pt's pain and severely limited mobility as stated by pt's wife.  Lurena Joiner stated she spoke with the imaging dept here @ [facility] who advised next available PET would be 2 weeks. If pt agrees, pt will complete imaging sooner through [facility] if possible. Lurena Joiner will communicate with [place] so we are can schedule consult accordingly.

## 2022-11-02 ENCOUNTER — Telehealth: Payer: Self-pay | Admitting: Radiation Oncology

## 2022-11-02 ENCOUNTER — Encounter (HOSPITAL_COMMUNITY): Payer: Self-pay

## 2022-11-02 ENCOUNTER — Ambulatory Visit (HOSPITAL_COMMUNITY): Payer: Medicare Other

## 2022-11-02 NOTE — Telephone Encounter (Signed)
LVM for Dr. Acie Fredrickson team to see if there has been an update on scheduling this pt's PET outside of [facility].

## 2022-11-04 ENCOUNTER — Telehealth: Payer: Self-pay | Admitting: Radiation Oncology

## 2022-11-04 NOTE — Telephone Encounter (Signed)
Received call from Dr. Acie Fredrickson assistant Lurena Joiner. Dr. Thornell Mule spoke to pt and spouse earlier today, pt has decided to proceed with hospice. Referral will be closed at this time.

## 2022-11-25 DIAGNOSIS — E039 Hypothyroidism, unspecified: Secondary | ICD-10-CM | POA: Diagnosis not present

## 2023-01-31 DEATH — deceased
